# Patient Record
Sex: Female | Born: 1940 | Race: Black or African American | Hispanic: No | Marital: Married | Smoking: Never smoker
Health system: Southern US, Community
[De-identification: ages and names within clinical notes are randomized; demographics above are authoritative.]

## PROBLEM LIST (undated history)

## (undated) DIAGNOSIS — S43006A Unspecified dislocation of unspecified shoulder joint, initial encounter: Secondary | ICD-10-CM

## (undated) DIAGNOSIS — I10 Essential (primary) hypertension: Secondary | ICD-10-CM

## (undated) DIAGNOSIS — E7800 Pure hypercholesterolemia, unspecified: Secondary | ICD-10-CM

## (undated) HISTORY — DX: Unspecified dislocation of unspecified shoulder joint, initial encounter: S43.006A

## (undated) HISTORY — PX: APPENDECTOMY: SHX54

## (undated) HISTORY — PX: CHOLECYSTECTOMY: SHX55

---

## 1999-06-10 ENCOUNTER — Ambulatory Visit (HOSPITAL_COMMUNITY): Admission: RE | Admit: 1999-06-10 | Discharge: 1999-06-10 | Payer: Self-pay | Admitting: Obstetrics & Gynecology

## 1999-10-07 ENCOUNTER — Emergency Department (HOSPITAL_COMMUNITY): Admission: EM | Admit: 1999-10-07 | Discharge: 1999-10-07 | Payer: Self-pay | Admitting: Emergency Medicine

## 2000-01-20 ENCOUNTER — Other Ambulatory Visit: Admission: RE | Admit: 2000-01-20 | Discharge: 2000-01-20 | Payer: Self-pay | Admitting: Obstetrics and Gynecology

## 2001-08-29 ENCOUNTER — Other Ambulatory Visit: Admission: RE | Admit: 2001-08-29 | Discharge: 2001-08-29 | Payer: Self-pay | Admitting: Obstetrics and Gynecology

## 2003-04-01 ENCOUNTER — Other Ambulatory Visit: Admission: RE | Admit: 2003-04-01 | Discharge: 2003-04-01 | Payer: Self-pay | Admitting: Obstetrics and Gynecology

## 2006-02-19 ENCOUNTER — Encounter: Admission: RE | Admit: 2006-02-19 | Discharge: 2006-02-19 | Payer: Self-pay | Admitting: Orthopedic Surgery

## 2006-09-21 ENCOUNTER — Ambulatory Visit: Payer: Self-pay | Admitting: Gastroenterology

## 2006-10-05 ENCOUNTER — Encounter (INDEPENDENT_AMBULATORY_CARE_PROVIDER_SITE_OTHER): Payer: Self-pay | Admitting: Specialist

## 2006-10-05 ENCOUNTER — Ambulatory Visit: Payer: Self-pay | Admitting: Gastroenterology

## 2007-02-01 ENCOUNTER — Encounter: Admission: RE | Admit: 2007-02-01 | Discharge: 2007-02-01 | Payer: Self-pay | Admitting: Orthopedic Surgery

## 2007-02-09 ENCOUNTER — Encounter: Admission: RE | Admit: 2007-02-09 | Discharge: 2007-02-09 | Payer: Self-pay | Admitting: Orthopedic Surgery

## 2007-02-12 ENCOUNTER — Encounter: Admission: RE | Admit: 2007-02-12 | Discharge: 2007-02-12 | Payer: Self-pay | Admitting: Orthopedic Surgery

## 2010-01-26 ENCOUNTER — Encounter: Admission: RE | Admit: 2010-01-26 | Discharge: 2010-01-26 | Payer: Self-pay | Admitting: Orthopedic Surgery

## 2011-03-18 ENCOUNTER — Ambulatory Visit
Admission: RE | Admit: 2011-03-18 | Discharge: 2011-03-18 | Disposition: A | Discharge status: Home / Self Care | Payer: Medicare Other | Source: Ambulatory Visit | Attending: Orthopedic Surgery | Admitting: Orthopedic Surgery

## 2011-03-18 ENCOUNTER — Other Ambulatory Visit: Payer: Self-pay | Admitting: Orthopedic Surgery

## 2011-03-18 DIAGNOSIS — R52 Pain, unspecified: Secondary | ICD-10-CM

## 2011-07-22 ENCOUNTER — Emergency Department (HOSPITAL_COMMUNITY)
Admission: EM | Admit: 2011-07-22 | Discharge: 2011-07-22 | Disposition: A | Discharge status: Home / Self Care | Payer: Medicare Other | Attending: Emergency Medicine | Admitting: Emergency Medicine

## 2011-07-22 ENCOUNTER — Emergency Department (HOSPITAL_COMMUNITY): Payer: Medicare Other

## 2011-07-22 DIAGNOSIS — I10 Essential (primary) hypertension: Secondary | ICD-10-CM | POA: Insufficient documentation

## 2011-07-22 DIAGNOSIS — R5381 Other malaise: Secondary | ICD-10-CM | POA: Insufficient documentation

## 2011-07-22 DIAGNOSIS — R05 Cough: Secondary | ICD-10-CM | POA: Insufficient documentation

## 2011-07-22 DIAGNOSIS — R5383 Other fatigue: Secondary | ICD-10-CM | POA: Insufficient documentation

## 2011-07-22 DIAGNOSIS — T6091XA Toxic effect of unspecified pesticide, accidental (unintentional), initial encounter: Secondary | ICD-10-CM | POA: Insufficient documentation

## 2011-07-22 DIAGNOSIS — R059 Cough, unspecified: Secondary | ICD-10-CM | POA: Insufficient documentation

## 2011-07-22 LAB — DIFFERENTIAL
Eosinophils Relative: 0 % (ref 0–5)
Lymphocytes Relative: 12 % (ref 12–46)
Lymphs Abs: 1.1 10*3/uL (ref 0.7–4.0)
Monocytes Absolute: 0.7 10*3/uL (ref 0.1–1.0)
Neutro Abs: 7.6 10*3/uL (ref 1.7–7.7)

## 2011-07-22 LAB — CBC
HCT: 38.3 % (ref 36.0–46.0)
MCHC: 35 g/dL (ref 30.0–36.0)
MCV: 88.7 fL (ref 78.0–100.0)
RDW: 12.9 % (ref 11.5–15.5)
WBC: 9.4 10*3/uL (ref 4.0–10.5)

## 2011-07-22 LAB — POCT I-STAT, CHEM 8
Hemoglobin: 14.3 g/dL (ref 12.0–15.0)
Potassium: 4 mEq/L (ref 3.5–5.1)
TCO2: 26 mmol/L (ref 0–100)

## 2011-07-22 LAB — POCT I-STAT TROPONIN I

## 2011-10-19 ENCOUNTER — Encounter: Payer: Self-pay | Admitting: Gastroenterology

## 2012-03-13 ENCOUNTER — Encounter: Payer: Self-pay | Admitting: Emergency Medicine

## 2012-03-24 ENCOUNTER — Emergency Department (HOSPITAL_COMMUNITY)
Admission: EM | Admit: 2012-03-24 | Discharge: 2012-03-24 | Disposition: A | Discharge status: Home / Self Care | Payer: Medicare Other | Attending: Emergency Medicine | Admitting: Emergency Medicine

## 2012-03-24 ENCOUNTER — Encounter (HOSPITAL_COMMUNITY): Payer: Self-pay | Admitting: *Deleted

## 2012-03-24 ENCOUNTER — Ambulatory Visit (INDEPENDENT_AMBULATORY_CARE_PROVIDER_SITE_OTHER): Payer: Medicare Other | Admitting: Emergency Medicine

## 2012-03-24 DIAGNOSIS — I10 Essential (primary) hypertension: Secondary | ICD-10-CM | POA: Insufficient documentation

## 2012-03-24 DIAGNOSIS — H5500 Unspecified nystagmus: Secondary | ICD-10-CM | POA: Insufficient documentation

## 2012-03-24 DIAGNOSIS — R110 Nausea: Secondary | ICD-10-CM | POA: Insufficient documentation

## 2012-03-24 DIAGNOSIS — H8110 Benign paroxysmal vertigo, unspecified ear: Secondary | ICD-10-CM | POA: Insufficient documentation

## 2012-03-24 DIAGNOSIS — E7800 Pure hypercholesterolemia, unspecified: Secondary | ICD-10-CM | POA: Insufficient documentation

## 2012-03-24 DIAGNOSIS — R42 Dizziness and giddiness: Secondary | ICD-10-CM

## 2012-03-24 DIAGNOSIS — R109 Unspecified abdominal pain: Secondary | ICD-10-CM | POA: Insufficient documentation

## 2012-03-24 DIAGNOSIS — Z79899 Other long term (current) drug therapy: Secondary | ICD-10-CM | POA: Insufficient documentation

## 2012-03-24 HISTORY — DX: Pure hypercholesterolemia, unspecified: E78.00

## 2012-03-24 HISTORY — DX: Essential (primary) hypertension: I10

## 2012-03-24 LAB — POCT CBC
HCT, POC: 41.1 % (ref 37.7–47.9)
Lymph, poc: 1.6 (ref 0.6–3.4)
MCHC: 32.4 g/dL (ref 31.8–35.4)
MCV: 91.1 fL (ref 80–97)
MPV: 9.3 fL (ref 0–99.8)
Platelet Count, POC: 336 10*3/uL (ref 142–424)
RDW, POC: 13.4 %
WBC: 6.5 10*3/uL (ref 4.6–10.2)

## 2012-03-24 LAB — POCT I-STAT, CHEM 8
Calcium, Ion: 1.24 mmol/L (ref 1.12–1.32)
Hemoglobin: 13.3 g/dL (ref 12.0–15.0)
Sodium: 144 mEq/L (ref 135–145)
TCO2: 25 mmol/L (ref 0–100)

## 2012-03-24 MED ORDER — ONDANSETRON 4 MG PO TBDP: 4.0000 mg | ORAL_TABLET | Freq: Three times a day (TID) | ORAL | Status: AC | PRN

## 2012-03-24 MED ORDER — DIAZEPAM 5 MG PO TABS: 5.0000 mg | ORAL_TABLET | Freq: Two times a day (BID) | ORAL | Status: AC

## 2012-03-24 MED ORDER — SODIUM CHLORIDE 0.9 % IV BOLUS (SEPSIS)
1000.0000 mL | Freq: Once | INTRAVENOUS | Status: AC
Start: 2012-03-24 — End: 2012-03-24
  Administered 2012-03-24: 1000 mL via INTRAVENOUS

## 2012-03-24 MED ORDER — ONDANSETRON 4 MG PO TBDP
8.0000 mg | ORAL_TABLET | Freq: Once | ORAL | Status: AC
  Administered 2012-03-24: 8 mg via ORAL

## 2012-03-24 MED ORDER — DIAZEPAM 5 MG PO TABS
5.0000 mg | ORAL_TABLET | Freq: Once | ORAL | Status: AC
  Administered 2012-03-24: 5 mg via ORAL
  Filled 2012-03-24: qty 1

## 2012-03-24 MED ORDER — PROMETHAZINE HCL 25 MG/ML IJ SOLN
12.5000 mg | Freq: Once | INTRAMUSCULAR | Status: AC
  Administered 2012-03-24: 12.5 mg via INTRAVENOUS
  Filled 2012-03-24: qty 1

## 2012-03-24 NOTE — ED Provider Notes (Signed)
History     CSN: [id number]  Arrival date & time 03/24/12  1240   First MD Initiated Contact with Patient 03/24/12 1243      Chief Complaint  Patient presents with  . Dizziness  . Nausea  . Abdominal Pain     HPI Per EMS. Pt woke up this AM with dizziness and nausea. Pt reports this was worse while laying down. Pt reports feels the room is spinning. Pt went to Urgent Care. Pt had a negative neuro exam. No monitor or blood work was completed.  Patient reports similar episodes in the past.  Past Medical History  Diagnosis Date  . Hypertension   . Hypercholesteremia     Past Surgical History  Procedure Date  . Cholecystectomy   . Appendectomy     No family history on file.  History  Substance Use Topics  . Smoking status: Never Smoker   . Smokeless tobacco: Not on file  . Alcohol Use: No    OB History    Grav Para Term Preterm Abortions TAB SAB Ect Mult Living                  Review of Systems  All other systems reviewed and are negative.    Allergies  Latex; Advil; Celebrex; Food; and Influenza vac split  Home Medications   Current Outpatient Rx  Name Route Sig Dispense Refill  . AMLODIPINE BESYLATE 10 MG PO TABS Oral Take 10 mg by mouth daily.    Marland Kitchen BILBERRY (VACCINIUM MYRTILLUS) 30 MG PO CAPS Oral Take 1 capsule by mouth daily.    Marland Kitchen CALCIUM CARBONATE-VITAMIN D 500-200 MG-UNIT PO TABS Oral Take 1 tablet by mouth daily.    Marland Kitchen COENZYME Q10 30 MG PO CAPS Oral Take 30 mg by mouth 3 (three) times daily.    Marland Kitchen OVER THE COUNTER MEDICATION Oral Take 1 tablet by mouth daily. Unidentified vitamin. Patient can not remember name of vitamin    . VITAMIN B-12 100 MCG PO TABS Oral Take 1,000 mcg by mouth daily.     Marland Kitchen DIAZEPAM 5 MG PO TABS Oral Take 1 tablet (5 mg total) by mouth 2 (two) times daily. 10 tablet 0  . ONDANSETRON 4 MG PO TBDP Oral Take 1 tablet (4 mg total) by mouth every 8 (eight) hours as needed for nausea. 20 tablet 0    BP 133/64  Pulse 71   Temp(Src) 97.5 F (36.4 C) (Oral)  Resp 16  SpO2 100%  Physical Exam  Nursing note and vitals reviewed. Constitutional: She is oriented to person, place, and time. She appears well-developed and well-nourished. No distress.  HENT:  Head: Normocephalic and atraumatic.  Eyes: Conjunctivae are normal. Pupils are equal, round, and reactive to light. Right eye exhibits nystagmus. Right eye exhibits normal extraocular motion. Left eye exhibits nystagmus. Left eye exhibits normal extraocular motion.  Neck: Normal range of motion.  Cardiovascular: Normal rate and intact distal pulses.         Date: 03/24/2012  Rate: 70  Rhythm: normal sinus rhythm  QRS Axis: normal  Intervals: normal  ST/T Wave abnormalities: normal  Conduction Disutrbances: none  Narrative Interpretation: unremarkable      Pulmonary/Chest: No respiratory distress.  Abdominal: Normal appearance. She exhibits no distension.  Musculoskeletal: Normal range of motion.  Neurological: She is alert and oriented to person, place, and time. She has normal strength. No cranial nerve deficit or sensory deficit. GCS eye subscore is 4. GCS verbal subscore is  5. GCS motor subscore is 6.       Positional vertigo positive  Skin: Skin is warm and dry. No rash noted.  Psychiatric: She has a normal mood and affect. Her behavior is normal.    ED Course  Procedures (including critical care time)  Labs Reviewed  POCT I-STAT, CHEM 8 - Abnormal; Notable for the following:    Glucose, Bld 108 (*)    All other components within normal limits  POCT I-STAT TROPONIN I   No results found.   1. Benign positional vertigo       MDM          Nelia Shi, MD 03/24/12 1446

## 2012-03-24 NOTE — Discharge Instructions (Signed)

## 2012-03-24 NOTE — ED Notes (Signed)
ZOX:[id number]<AV> Expected date:<BR> Expected time:12:38 PM<BR> Means of arrival:Ambulance<BR> Comments:<BR> M90 -- Vertigo

## 2012-03-24 NOTE — ED Notes (Signed)
Per EMS. Pt woke up this AM with dizziness and nausea. Pt reports this was worse while laying down. Pt reports feels the room is spinning. Pt went to Urgent Care.  Pt had a negative neuro exam.  No monitor or blood work was completed.

## 2012-03-24 NOTE — Progress Notes (Signed)
  Subjective:    Patient ID: Judy Caldwell, female    DOB: [birth year], 71 y.o.   MRN: [id number]  HPI patient with history of hypertension presents with onset on Tuesday of nausea. She has had a very poor appetite due to the nausea. She has not had any actual vomiting but has had episodes of gagging. She has a history of vertigo in the she has no history of any gastrointestinal problems. This morning she had extreme dizziness she described severe vertiginous symptoms as she leaned back. Since then she's had extreme nausea and difficulty walking.    Review of Systems she is a history of hypertension on medication she has a history of high cholesterol but is not on statins. She has a history of multiple drug allergies     Objective:   Physical Exam physical exam reveals an African woman who does appear somewhat pale. She needed assistance from the chair to the exam table. Her pupils were equal and reactive I did not disclose any disconjugate eye movements. She did not have diplopia on comp temptation to carotids were 2+ no murmurs cardiac exam revealed a regular rate and rhythm without murmurs. When she was laid back she had extreme vertiginous symptoms with nausea. She had some gagging but was not able to vomit.        Assessment & Plan:   Symptoms are most consistent with acute labyrinthitis. She does have high cholesterol and high blood pressure and therefore is at risk for vertebral basilar issues. She is unable to manage at home because she is unable to walk and to extreme vertigo and unsteadiness. We'll go ahead and start IV line and gave Zofran 8 mg ODT to see if we can help with the nausea.

## 2012-03-24 NOTE — ED Notes (Signed)
Pt reports she woke up with vertigo this AM.  Pt reports history of vertigo and typically associated with ear issue.  Pt was nauseated earlier this AM and reports mild nausea now.  Pt denies fevers.  Pt denies issues with ambulation today and denies weakness.

## 2012-04-08 IMAGING — CR DG CHEST 2V
2 series · 2 of 2 positions shown · non-contrast
Comparison: None

CLINICAL DATA: Cough and weakness; possible accidental inhalation
of insect spray.

CHEST - 2 VIEW

[w chest pa]
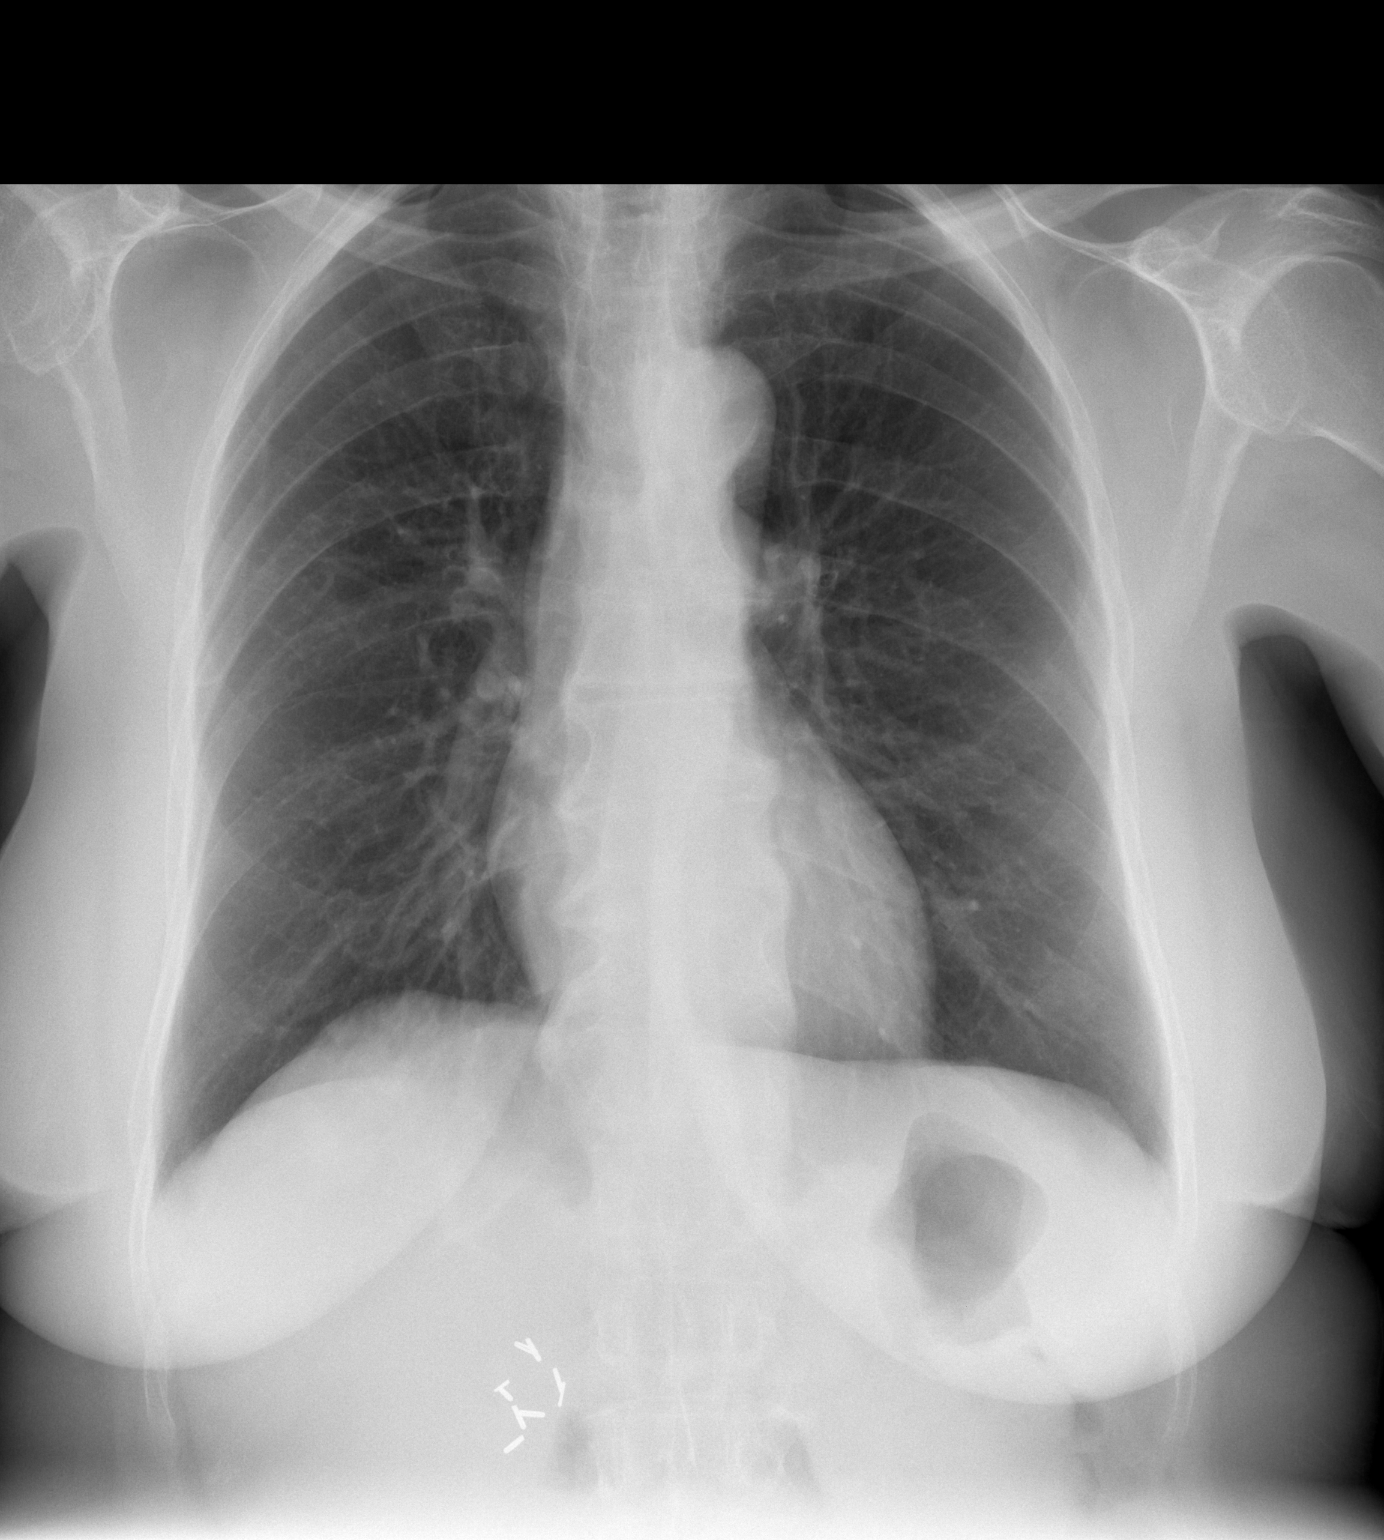

[w chest lat]
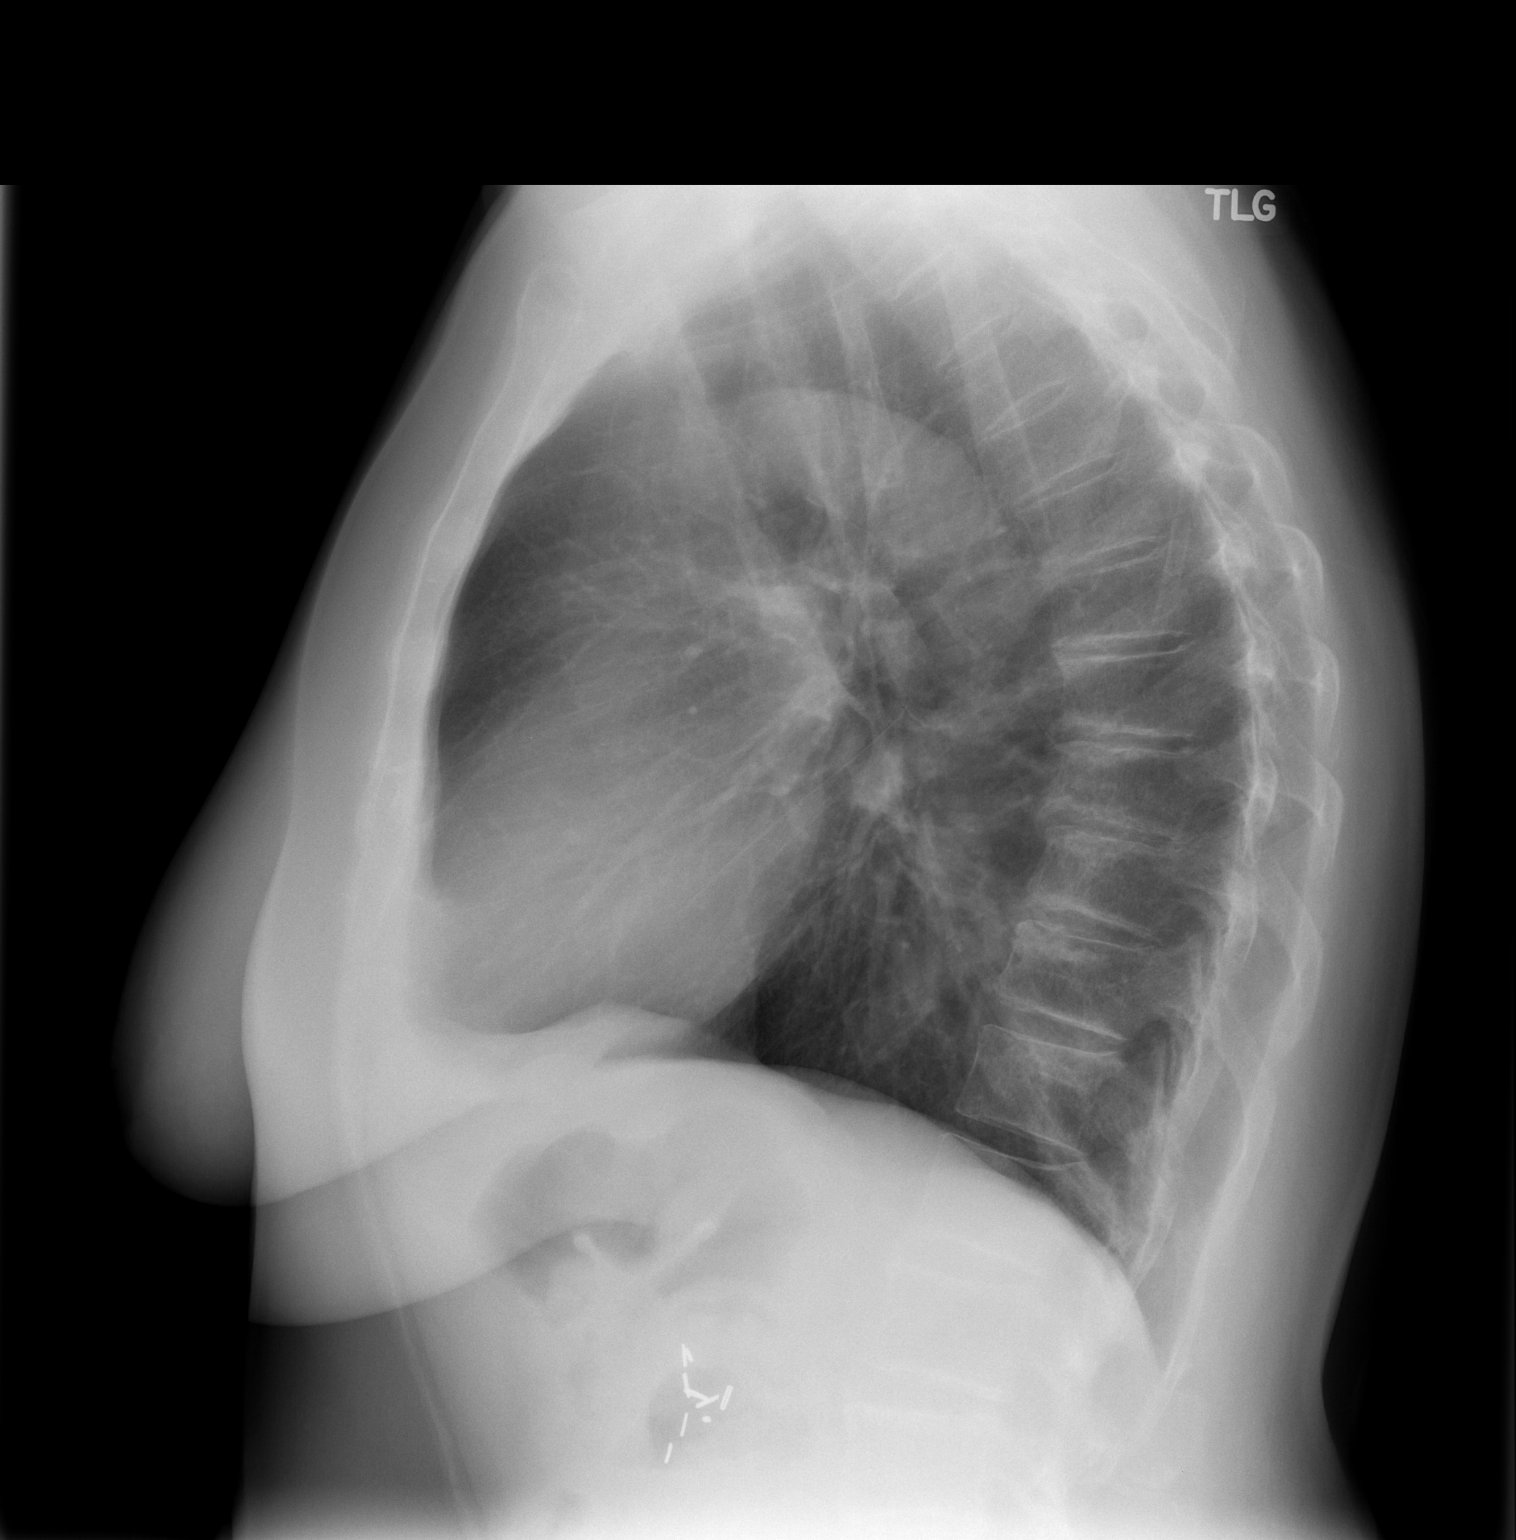

[2 of 2 positions shown; findings below may reference images not displayed]

FINDINGS: The lungs are well-aerated and clear.  There is no
evidence of focal opacification, pleural effusion or pneumothorax.

The heart is normal in size; the mediastinal contour is within
normal limits.  No acute osseous abnormalities are seen.  Clips are
noted within the right upper quadrant, reflecting prior
cholecystectomy.
IMPRESSION: No acute cardiopulmonary process seen.

## 2012-05-14 ENCOUNTER — Ambulatory Visit (INDEPENDENT_AMBULATORY_CARE_PROVIDER_SITE_OTHER): Payer: MEDICARE | Admitting: Emergency Medicine

## 2012-05-14 VITALS — BP 153/80 | HR 70 | Temp 97.9°F | Resp 16 | Ht 62.0 in | Wt 154.6 lb

## 2012-05-14 DIAGNOSIS — M792 Neuralgia and neuritis, unspecified: Secondary | ICD-10-CM

## 2012-05-14 DIAGNOSIS — IMO0002 Reserved for concepts with insufficient information to code with codable children: Secondary | ICD-10-CM

## 2012-05-14 DIAGNOSIS — B029 Zoster without complications: Secondary | ICD-10-CM

## 2012-05-14 MED ORDER — GABAPENTIN 100 MG PO CAPS: ORAL_CAPSULE | ORAL | Status: DC

## 2012-05-14 MED ORDER — VALACYCLOVIR HCL 1 G PO TABS: ORAL_TABLET | ORAL | Status: DC

## 2012-05-14 NOTE — Progress Notes (Signed)
  Subjective:    Patient ID: Judy Caldwell, female    DOB: Jan 24, 1941, 71 y.o.   MRN: [id number]  HPI patient in for a burning stinging pain located over the right scapula and right shoulder area. She denies any rash or pain in her neck. There is some question about when she was visiting her daughter a few months ago she was diagnosed with shingles at that time she has never received the shingles vaccine.    Review of Systems she denies chest pain shortness of breath or any other symptoms.     Objective:   Physical Exam there is no rash noted over the right scapula right side of her chest. Breath sounds are clear she has exquisite sensitivity to the skin in that area.        Assessment & Plan:  Patient has hyperesthesia of the skin over the scapula and shoulder this could be early shingles. She certainly is reasonable to treat her with Valtrex and Neurontin.

## 2012-05-14 NOTE — Patient Instructions (Signed)
Postherpetic Neuralgia  Shingles is a painful disease. It is caused by the herpes zoster virus. This is the same virus which also causes chickenpox. It can affect the torso, limbs, or the face. For most people, shingles is a condition of rather sudden onset. Pain usually lasts about 1 month.  In older patients, or patients with poor immune systems, a painful, long-standing (chronic) condition called postherpetic neuralgia can develop. This condition rarely happens before age 50. But at least 50% of people over 50 become affected following an attack of shingles. There is a natural tendency for this condition to improve over time with no treatment. Less than 5% of patients have pain that lasts for more than 1 year.  DIAGNOSIS   Herpes is usually easily diagnosed on physical exam. Pain sometimes follows when the skin sores (lesions) have disappeared. It is called postherpetic neuralgia. That name simply means the pain that follows herpes.  TREATMENT    Treating this condition may be difficult. Usually one of the tricyclic antidepressants, often amitriptyline, is the first line of treatment. There is evidence that the sooner these medications are given, the more likely they are to reduce pain.   Conventional analgesics, regional nerve blocks, and anticonvulsants have little benefit in most cases when used alone. Other tricyclic anti-depressants are used as a second option if the first antidepressant is unsuccessful.   Anticonvulsants, including carbamazepine, have been found to provide some added benefit when used with a tricyclic anti-depressant. This is especially for the stabbing type of pain similar to that of trigeminal neuralgia.   Chronic opioid therapy. This is a strong narcotic pain medication. It is used to treat pain that is resistant to other measures. The issues of dependency and tolerance can be reduced with closely managed care.   Some cream treatments are applied locally to the affected area. They  can help when used with other treatments. Their use may be difficult in the case of postherpetic trigeminal neuralgia. This is involved with the face. So the substances can irritate the eye and the skin around the eye. Examples of creams used include Capsaicin and lidocaine creams.   For shingles, antiviral therapies along with analgesics are recommended. Studies of the effect of anti-viral agents such as acyclovir on shingles have been done. They show improved rates of healing and decreased severity of sudden (acute) pain. Some observations suggest that nerve blocks during shingles infection will:   Reduce pain.   Shorten the acute episode.   Prevent the emergence of postherpetic neuralgia.  Viral medications used include Acyclovir (Zovirax), Valacyclovir, Famciclovir and a lysine diet.  Document Released: 02/11/2003 Document Revised: 11/10/2011 Document Reviewed: 11/21/2005  ExitCare Patient Information 2012 [employer].

## 2012-05-15 ENCOUNTER — Other Ambulatory Visit: Payer: Self-pay | Admitting: *Deleted

## 2012-05-15 DIAGNOSIS — M792 Neuralgia and neuritis, unspecified: Secondary | ICD-10-CM

## 2012-05-15 MED ORDER — GABAPENTIN 100 MG PO CAPS: ORAL_CAPSULE | ORAL | Status: DC

## 2012-07-02 ENCOUNTER — Telehealth: Payer: Self-pay

## 2012-07-02 NOTE — Telephone Encounter (Signed)
Request a refill on blood pressure medication and she knows that she needs an appt with dr daub-not sure if scheduled yet or not-she is hoping that we could call this in tonight because her daughter has been in an accident and she is  Needing to get to [employer]

## 2012-07-03 MED ORDER — AMLODIPINE BESYLATE 10 MG PO TABS: 10.0000 mg | ORAL_TABLET | Freq: Every day | ORAL | Status: DC

## 2012-07-03 NOTE — Telephone Encounter (Signed)
Sent to pharmacy 

## 2012-07-03 NOTE — Telephone Encounter (Signed)
Pt calling about her Norvasc 10mg , she needs it sent to [facility] [place] she states she has appt the middle of August with Dr Cleta Alberts.

## 2012-07-03 NOTE — Telephone Encounter (Signed)
LMOM notifying patient rx sent in. 

## 2012-07-31 ENCOUNTER — Encounter: Payer: Self-pay | Admitting: Emergency Medicine

## 2012-07-31 ENCOUNTER — Ambulatory Visit (INDEPENDENT_AMBULATORY_CARE_PROVIDER_SITE_OTHER): Payer: MEDICARE | Admitting: Emergency Medicine

## 2012-07-31 VITALS — BP 148/74 | HR 83 | Temp 98.2°F | Resp 16 | Ht 62.25 in | Wt 155.0 lb

## 2012-07-31 DIAGNOSIS — E782 Mixed hyperlipidemia: Secondary | ICD-10-CM

## 2012-07-31 DIAGNOSIS — R7989 Other specified abnormal findings of blood chemistry: Secondary | ICD-10-CM

## 2012-07-31 DIAGNOSIS — M792 Neuralgia and neuritis, unspecified: Secondary | ICD-10-CM

## 2012-07-31 DIAGNOSIS — I10 Essential (primary) hypertension: Secondary | ICD-10-CM

## 2012-07-31 DIAGNOSIS — M79609 Pain in unspecified limb: Secondary | ICD-10-CM

## 2012-07-31 DIAGNOSIS — R739 Hyperglycemia, unspecified: Secondary | ICD-10-CM

## 2012-07-31 MED ORDER — AMLODIPINE BESYLATE 10 MG PO TABS: 10.0000 mg | ORAL_TABLET | Freq: Every day | ORAL | Status: DC

## 2012-07-31 MED ORDER — GABAPENTIN 100 MG PO CAPS: ORAL_CAPSULE | ORAL | Status: DC

## 2012-07-31 NOTE — Progress Notes (Signed)
  Subjective:    Patient ID: Judy Caldwell, female    DOB: 05/08/1941, 71 y.o.   MRN: [id number]  HPI Judy Caldwell comes in for followup of her high blood pressure. She has had borderline high sugars in the past and this needs to be checked. She does have pain in her back down her right leg in an area where she has had shingles. She does walk with the assistance of a cane and does have some significant orthopedic problems. She sees her brother-in-law Dr. Myrtie Neither for these problems. The last visit here she was seen emergently and sent to the hospital for evaluation because of severe vertigo. Workup at that time was unremarkable.    Review of Systems     Objective:   Physical Exam HEENT exam is unremarkable her neck is supple chest clear heart regular rate no murmurs her abdomen was soft and nontender.        Assessment & Plan:  I refilled her Norvasc today for blood pressure. I have restarted her Neurontin 300 mg at night and she can take 100 mg capsules 1-2 during the day as tolerated.

## 2012-08-28 ENCOUNTER — Other Ambulatory Visit: Payer: Self-pay | Admitting: Physician Assistant

## 2012-09-06 ENCOUNTER — Encounter: Payer: Self-pay | Admitting: Gastroenterology

## 2012-12-17 ENCOUNTER — Encounter: Payer: Self-pay | Admitting: Gastroenterology

## 2013-01-15 ENCOUNTER — Ambulatory Visit (AMBULATORY_SURGERY_CENTER): Payer: MEDICARE | Admitting: *Deleted

## 2013-01-15 VITALS — Ht 62.0 in | Wt 148.0 lb

## 2013-01-15 DIAGNOSIS — Z1211 Encounter for screening for malignant neoplasm of colon: Secondary | ICD-10-CM

## 2013-01-15 MED ORDER — NA SULFATE-K SULFATE-MG SULF 17.5-3.13-1.6 GM/177ML PO SOLN: ORAL | Status: DC

## 2013-01-15 NOTE — Progress Notes (Signed)
Pt has allergy to eggs causing throat to swell.  Sedation changed to Moderate sedation on schedule.

## 2013-01-29 ENCOUNTER — Encounter: Payer: Self-pay | Admitting: Gastroenterology

## 2013-01-29 ENCOUNTER — Ambulatory Visit (AMBULATORY_SURGERY_CENTER): Payer: Medicare HMO | Admitting: Gastroenterology

## 2013-01-29 VITALS — BP 120/66 | HR 72 | Temp 97.5°F | Resp 9 | Ht 62.0 in | Wt 148.0 lb

## 2013-01-29 DIAGNOSIS — D126 Benign neoplasm of colon, unspecified: Secondary | ICD-10-CM

## 2013-01-29 MED ORDER — SODIUM CHLORIDE 0.9 % IV SOLN: 500.0000 mL | INTRAVENOUS | Status: DC

## 2013-01-29 NOTE — Patient Instructions (Addendum)
YOU HAD AN ENDOSCOPIC PROCEDURE TODAY AT THE [facility]: Refer to the procedure report that was given to you for any specific questions about what was found during the examination.  If the procedure report does not answer your questions, please call your gastroenterologist to clarify.  If you requested that your care partner not be given the details of your procedure findings, then the procedure report has been included in a sealed envelope for you to review at your convenience later.  YOU SHOULD EXPECT: Some feelings of bloating in the abdomen. Passage of more gas than usual.  Walking can help get rid of the air that was put into your GI tract during the procedure and reduce the bloating. If you had a lower endoscopy (such as a colonoscopy or flexible sigmoidoscopy) you may notice spotting of blood in your stool or on the toilet paper. If you underwent a bowel prep for your procedure, then you may not have a normal bowel movement for a few days.  DIET: Your first meal following the procedure should be a light meal and then it is ok to progress to your normal diet.  A half-sandwich or bowl of soup is an example of a good first meal.  Heavy or fried foods are harder to digest and may make you feel nauseous or bloated.  Likewise meals heavy in dairy and vegetables can cause extra gas to form and this can also increase the bloating.  Drink plenty of fluids but you should avoid alcoholic beverages for 24 hours.  PLEASE TRY TO EAT A HIGH FIBER DIET TO DECREASE YOUR CHANCES OF GETTING DIVERTICULITIS.  ACTIVITY: Your care partner should take you home directly after the procedure.  You should plan to take it easy, moving slowly for the rest of the day.  You can resume normal activity the day after the procedure however you should NOT DRIVE or use heavy machinery for 24 hours (because of the sedation medicines used during the test).    SYMPTOMS TO REPORT IMMEDIATELY: A gastroenterologist can be reached  at any hour.  During normal business hours, 8:30 AM to 5:00 PM Monday through Friday, call [phone].  After hours and on weekends, please call the GI answering service at [phone] who will take a message and have the physician on call contact you   Following lower endoscopy (colonoscopy or flexible sigmoidoscopy):  Excessive amounts of blood in the stool  Significant tenderness or worsening of abdominal pains  Swelling of the abdomen that is new, acute  Fever of 100F or higher  FOLLOW UP: If any biopsies were taken you will be contacted by phone or by letter within the next 1-3 weeks.  Call your gastroenterologist if you have not heard about the biopsies in 3 weeks.  Our staff will call the home number listed on your records the next business day following your procedure to check on you and address any questions or concerns that you may have at that time regarding the information given to you following your procedure. This is a courtesy call and so if there is no answer at the home number and we have not heard from you through the emergency physician on call, we will assume that you have returned to your regular daily activities without incident.  SIGNATURES/CONFIDENTIALITY: You and/or your care partner have signed paperwork which will be entered into your electronic medical record.  These signatures attest to the fact that that the information above on your After  Visit Summary has been reviewed and is understood.  Full responsibility of the confidentiality of this discharge information lies with you and/or your care-partner.  Thank-you for choosing [place] for your medical needs today.

## 2013-01-29 NOTE — Progress Notes (Signed)
Sedation vewrsed fentanly pt tolerated procedure well awake to pacu

## 2013-01-29 NOTE — Op Note (Signed)
[facility] [address] [place], [address]   COLONOSCOPY PROCEDURE REPORT  PATIENT: Judy Caldwell, Judy Caldwell  MR#: [id number] BIRTHDATE: 1941-05-20 , 71  yrs. old GENDER: Female ENDOSCOPIST: Louis Meckel, MD REFERRED [id number] Cleta Alberts, M.D. PROCEDURE DATE:  01/29/2013 PROCEDURE:   Colonoscopy with cold biopsy polypectomy ASA CLASS:   Class II INDICATIONS:average risk screening. MEDICATIONS: Versed 4 mg IV, Fentanyl-Quick Pick, and Fentanyl-Detailed 250 mcg IV  DESCRIPTION OF PROCEDURE:   After the risks benefits and alternatives of the procedure were thoroughly explained, informed consent was obtained.  A digital rectal exam revealed no abnormalities of the rectum.   The LB [id number]  endoscope was introduced through the anus and advanced to the cecum, which was identified by both the appendix and ileocecal valve. No adverse events experienced.   The quality of the prep was Suprep good  The instrument was then slowly withdrawn as the colon was fully examined.      COLON FINDINGS: A sessile polyp measuring 2 mm in size was found in the ascending colon.  A polypectomy was performed with cold forceps.  The resection was complete and the polyp tissue was completely retrieved.   Severe diverticulosis was noted in the sigmoid colon.   The colon mucosa was otherwise normal. Retroflexed views revealed no abnormalities. The time to cecum=2 minutes 28 seconds.  Withdrawal time=7 minutes 33 seconds.  The scope was withdrawn and the procedure completed. COMPLICATIONS: There were no complications.  ENDOSCOPIC IMPRESSION: 1.   Sessile polyp measuring 2 mm in size was found in the ascending colon; polypectomy was performed with cold forceps 2.   Severe diverticulosis was noted in the sigmoid colon 3.   The colon mucosa was otherwise normal  RECOMMENDATIONS: If the polyp(s) removed today are proven to be adenomatous (pre-cancerous) polyps, you will need a  repeat colonoscopy in 5 years.  Otherwise you should continue to follow colorectal cancer screening guidelines for "routine risk" patients with colonoscopy in 10 years.  You will receive a letter within 1-2 weeks with the results of your biopsy as well as final recommendations.  Please call my office if you have not received a letter after 3 weeks.   eSigned:  Louis Meckel, MD 01/29/2013 11:41 AM   cc:

## 2013-01-29 NOTE — Progress Notes (Addendum)
Patient did not have preoperative order for IV antibiotic SSI prophylaxis. (G8918)  Patient did not experience any of the following events: a burn prior to discharge; a fall within the facility; wrong site/side/patient/procedure/implant event; or a hospital transfer or hospital admission upon discharge from the facility. (G8907)  

## 2013-01-30 ENCOUNTER — Telehealth: Payer: Self-pay

## 2013-01-30 NOTE — Telephone Encounter (Signed)
  Follow up Call-  Call back number 01/29/2013  Post procedure Call Back phone  # [phone]  Permission to leave phone message Yes     Patient questions:  Do you have a fever, pain , or abdominal swelling? no Pain Score  0 *  Have you tolerated food without any problems? yes  Have you been able to return to your normal activities? yes  Do you have any questions about your discharge instructions: Diet   no Medications  no Follow up visit  no  Do you have questions or concerns about your Care? no  Actions: * If pain score is 4 or above: No action needed, pain <4.

## 2013-02-05 ENCOUNTER — Encounter: Payer: Self-pay | Admitting: Gastroenterology

## 2013-08-28 ENCOUNTER — Ambulatory Visit (INDEPENDENT_AMBULATORY_CARE_PROVIDER_SITE_OTHER): Payer: Medicare HMO | Admitting: Cardiovascular Disease

## 2013-08-28 ENCOUNTER — Encounter: Payer: Self-pay | Admitting: Cardiovascular Disease

## 2013-08-28 VITALS — BP 144/90 | HR 89 | Ht 62.0 in | Wt 148.3 lb

## 2013-08-28 DIAGNOSIS — Z79899 Other long term (current) drug therapy: Secondary | ICD-10-CM

## 2013-08-28 DIAGNOSIS — I10 Essential (primary) hypertension: Secondary | ICD-10-CM

## 2013-08-28 DIAGNOSIS — E785 Hyperlipidemia, unspecified: Secondary | ICD-10-CM

## 2013-08-28 NOTE — Patient Instructions (Addendum)
Your physician recommends that you return for lab work when fasting. We will call you with the lab results.  Your physician recommends that you schedule a follow-up appointment in: 12 months

## 2013-08-29 ENCOUNTER — Telehealth: Payer: Self-pay | Admitting: Cardiovascular Disease

## 2013-08-29 DIAGNOSIS — E785 Hyperlipidemia, unspecified: Secondary | ICD-10-CM | POA: Insufficient documentation

## 2013-08-29 DIAGNOSIS — I10 Essential (primary) hypertension: Secondary | ICD-10-CM | POA: Insufficient documentation

## 2013-08-29 LAB — LIPID PANEL
LDL Cholesterol: 167 mg/dL — ABNORMAL HIGH (ref 0–99)
Total CHOL/HDL Ratio: 3.2 Ratio
VLDL: 14 mg/dL (ref 0–40)

## 2013-08-29 LAB — COMPREHENSIVE METABOLIC PANEL
ALT: 13 U/L (ref 0–35)
AST: 22 U/L (ref 0–37)
Albumin: 4.3 g/dL (ref 3.5–5.2)
Alkaline Phosphatase: 57 U/L (ref 39–117)
Calcium: 9.6 mg/dL (ref 8.4–10.5)
Chloride: 105 mEq/L (ref 96–112)
Potassium: 4 mEq/L (ref 3.5–5.3)

## 2013-08-29 MED ORDER — AMLODIPINE BESYLATE 10 MG PO TABS: 10.0000 mg | ORAL_TABLET | Freq: Every day | ORAL | Status: DC

## 2013-08-29 NOTE — Telephone Encounter (Signed)
Can we refill her Norvasc (generic) for her since this was given at Urgent Care.  She saw Dr. Salena Saner yesterday.  [facility] [address].

## 2013-08-29 NOTE — Progress Notes (Signed)
Patient ID: Judy Caldwell, female   DOB: 23-Feb-1941, 72 y.o.   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 She is mildly overweight and has actually lost 10 pounds since her last appointment. This is apparently secondary to poor appetite and has occurred in parallel with a protracted course of intercostal shingles. She has had 2 take antivirals for recurrent episodes of shingles.  Her family indicated via one of our nurses that they're worried that she may have some memory problems. I gently inquired about this and the patient denies any issues with her memory. As far as I can tell from a quick bedside assessment I do not find clear evidence of short-term memory lapses, although there may be some issues with attention.  She has known hypercholesterolemia primarily elevated LDL cholesterol with excellent HDL and triglyceride levels. In March of 2013 total close was 236, triglycerides 60, HDL 74 and LDL 150. I had recommended treatment with a statin but she declined because of previous side effects: "It altered my body".    Allergies  Allergen Reactions  . Latex Hives  . Advil [Ibuprofen] Nausea Only  . Celebrex [Celecoxib] Nausea Only  . Eggs Or Egg-Derived Products Swelling    THROAT CLOSES  . Influenza Vac Split [Flu Virus Vaccine] Other (See Comments)    Throat swells    Current Outpatient Prescriptions  Medication Sig Dispense Refill  . amLODipine (NORVASC) 10 MG tablet TAKE 1 TABLET BY MOUTH EVERY DAY  30 tablet  0  . calcium-vitamin D (OSCAL WITH D) 500-200 MG-UNIT per tablet Take 1 tablet by mouth daily.      Marland Kitchen co-enzyme Q-10 30 MG capsule Take 30 mg by mouth 3 (three) times daily.      . COCONUT OIL PO Take by mouth 2 (two) times daily.      Marland Kitchen gabapentin (NEURONTIN) 100 MG capsule as needed. Take 1 cap daily  for 2 days , 2 caps daily for 2 days, then 3 caps daily      . valACYclovir (VALTREX) 1000 MG tablet as needed. Take one tablet 3 times a day for one week      . vitamin B-12 (CYANOCOBALAMIN) 100 MCG tablet Take 1,000 mcg by mouth daily.        No current facility-administered medications for this visit.    Past Medical History  Diagnosis Date  . Hypertension   . Hypercholesteremia   . Shoulder joint dislocation     Past Surgical History  Procedure Laterality Date  . Cholecystectomy    . Appendectomy      Family History  Problem Relation Age of Onset  . Colon cancer Neg Hx   . Stomach cancer Neg Hx   . Kidney disease Mother     History   Social History  . Marital Status: Married    Spouse Name: N/A    Number of Children: N/A  . Years of Education: N/A   Occupational History  . Not on file.   Social History Main Topics  . Smoking status: Never Smoker   . Smokeless tobacco: Never Used  . Alcohol Use: No  . Drug Use: No  . Sexual Activity: Not on file   Other Topics Concern  . Not on file   Social History Narrative  . No narrative on file  Review of systems: The patient specifically denies any chest pain at rest or with exertion, dyspnea at rest or with exertion, orthopnea, paroxysmal nocturnal dyspnea, syncope, palpitations, focal neurological deficits, intermittent claudication, lower extremity edema, unexplained weight gain, cough, hemoptysis or wheezing.  The patient also denies abdominal pain, nausea, vomiting, dysphagia, diarrhea, constipation, polyuria, polydipsia, dysuria, hematuria, frequency, urgency, abnormal bleeding or bruising, fever, chills, unexpected weight changes, mood swings, change in skin or hair texture, change in voice quality, auditory or visual problems, allergic reactions or rashes, new musculoskeletal complaints other than usual "aches and pains".   PHYSICAL EXAM BP 144/90  Pulse 89  Ht 5\' 2"  (1.575 m)  Wt 148 lb 4.8 oz (67.268  kg)  BMI 27.12 kg/m2  General: Alert, oriented x3, no distress Head: no evidence of trauma, PERRL, EOMI, no exophtalmos or lid lag, no myxedema, no xanthelasma; normal ears, nose and oropharynx Neck: normal jugular venous pulsations and no hepatojugular reflux; brisk carotid pulses without delay and no carotid bruits Chest: clear to auscultation, no signs of consolidation by percussion or palpation, normal fremitus, symmetrical and full respiratory excursions; healing zoster eruption Cardiovascular: normal position and quality of the apical impulse, regular rhythm, normal first and second heart sounds, no murmurs, rubs or gallops Abdomen: no tenderness or distention, no masses by palpation, no abnormal pulsatility or arterial bruits, normal bowel sounds, no hepatosplenomegaly Extremities: no clubbing, cyanosis or edema; 2+ radial, ulnar and brachial pulses bilaterally; 2+ right femoral, posterior tibial and dorsalis pedis pulses; 2+ left femoral, posterior tibial and dorsalis pedis pulses; no subclavian or femoral bruits Neurological: grossly nonfocal   EKG: Normal sinus rhythm, normal tracing   BMET    Component Value Date/Time   NA 138 08/28/2013 1627   K 4.0 08/28/2013 1627   CL 105 08/28/2013 1627   CO2 28 08/28/2013 1627   GLUCOSE 95 08/28/2013 1627   BUN 11 08/28/2013 1627   CREATININE 0.83 08/28/2013 1627   CREATININE 0.70 03/24/2012 1321   CALCIUM 9.6 08/28/2013 1627     ASSESSMENT AND PLAN Hyperlipidemia We will have to order laboratory tests. At her last appointment her total cholesterol is 236 and LDL cholesterol was 150. I recommended that she start taking pravastatin 40 mg daily. She was very reluctant because of previous side effects with cholesterol lower medications and after discussion with her brother (was a physician in (decided not to take medications. Have recommended that we recheck the laboratory results before we make further recommendations. Her memory issues do need  to be explored further. If her lipid profile is similar to last year, then the indication for statin is borderline. If they truly are memory deficits confirmed by mental status examination, this may prove to be a decisive factor in avoiding statin therapy.  HTN (hypertension) Her blood pressure is borderline elevated today. She doesn't wish he checks it at home with typical blood pressure be around 125/85 mm Hg. Indeed when I rechecked it at the end of the visit her blood pressure was down to 136/87 mm Hg. Her diastolic blood pressure is borderline elevated still. Important to note that she has normal renal function. No changes were made in her medications.   Orders Placed This Encounter  Procedures  . Lipid Profile  . Comp Met (CMET)  . EKG 12-Lead   Meds ordered this encounter  Medications  . COCONUT OIL PO    Sig: Take by mouth 2 (two) times daily.  . valACYclovir (VALTREX) 1000 MG tablet  Sig: as needed. Take one tablet 3 times a day for one week  . gabapentin (NEURONTIN) 100 MG capsule    Sig: as needed. Take 1 cap daily for 2 days , 2 caps daily for 2 days, then 3 caps daily    Tamana Hatfield  Thurmon Fair, MD, [facility] [phone] office [phone] pager

## 2013-08-29 NOTE — Assessment & Plan Note (Addendum)
We will have to order laboratory tests. At her last appointment her total cholesterol is 236 and LDL cholesterol was 150. I recommended that she start taking pravastatin 40 mg daily. She was very reluctant because of previous side effects with cholesterol lower medications and after discussion with her brother (was a physician in (decided not to take medications. Have recommended that we recheck the laboratory results before we make further recommendations. Her memory issues do need to be explored further. If her lipid profile is similar to last year, then the indication for statin is borderline. If they truly are memory deficits confirmed by mental status examination, this may prove to be a decisive factor in avoiding statin therapy.

## 2013-08-29 NOTE — Telephone Encounter (Signed)
Returned call.  Pt informed message received.  Confirmed dose and Refill(s) sent to pharmacy.  Pt verbalized understanding and agreed w/ plan.

## 2013-08-29 NOTE — Assessment & Plan Note (Addendum)
Her blood pressure is borderline elevated today. She doesn't wish he checks it at home with typical blood pressure be around 125/85 mm Hg. Indeed when I rechecked it at the end of the visit her blood pressure was down to 136/87 mm Hg. Her diastolic blood pressure is borderline elevated still. Important to note that she has normal renal function. No changes were made in her medications.

## 2013-09-23 ENCOUNTER — Encounter: Payer: Self-pay | Admitting: Cardiovascular Disease

## 2013-11-26 ENCOUNTER — Ambulatory Visit (INDEPENDENT_AMBULATORY_CARE_PROVIDER_SITE_OTHER): Payer: Medicare HMO | Admitting: Cardiovascular Disease

## 2013-11-26 ENCOUNTER — Encounter: Payer: Self-pay | Admitting: Cardiovascular Disease

## 2013-11-26 VITALS — BP 132/68 | HR 80 | Resp 16 | Ht 62.5 in | Wt 143.1 lb

## 2013-11-26 DIAGNOSIS — R413 Other amnesia: Secondary | ICD-10-CM

## 2013-11-26 DIAGNOSIS — E785 Hyperlipidemia, unspecified: Secondary | ICD-10-CM

## 2013-11-26 DIAGNOSIS — Z79899 Other long term (current) drug therapy: Secondary | ICD-10-CM

## 2013-11-26 DIAGNOSIS — I10 Essential (primary) hypertension: Secondary | ICD-10-CM

## 2013-11-26 MED ORDER — PRAVASTATIN SODIUM 40 MG PO TABS: 40.0000 mg | ORAL_TABLET | Freq: Every evening | ORAL | Status: DC

## 2013-11-26 NOTE — Patient Instructions (Addendum)
Your physician recommends that you schedule a follow-up appointment in: 4 WEEKS with Dr Royann Shivers  PRAVACHOL 40 MG MG DAILY  FASTING LIPID PROFILE & CMET  REFERRAL TO [facility] FOR MEMORY PROBLEMS

## 2013-12-10 ENCOUNTER — Encounter: Payer: Self-pay | Admitting: Cardiovascular Disease

## 2013-12-10 NOTE — Progress Notes (Signed)
Patient ID: Judy Caldwell, female   DOB: 05-22-1941, 73 y.o.   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 She is minimally overweight and has actually lost another 5 pounds since her last appointment. This is apparently secondary to poor appetite.  She has had 2 take antivirals for recurrent episodes of shingles.  Her family again indicates a concern about memory problems, again. She has known hypercholesterolemia: primarily elevated LDL cholesterol with excellent HDL and triglyceride levels. I had recommended treatment with a statin but she declined because of previous side effects ("It altered my body").  She denies cardiac complaints today. Her BP is better.    Allergies  Allergen Reactions  . Latex Hives  . Advil [Ibuprofen] Nausea Only  . Celebrex [Celecoxib] Nausea Only  . Eggs Or Egg-Derived Products Swelling    THROAT CLOSES  . Influenza Vac Split [Flu Virus Vaccine] Other (See Comments)    Throat swells    Current Outpatient Prescriptions  Medication Sig Dispense Refill  . amLODipine (NORVASC) 10 MG tablet Take 1 tablet (10 mg total) by mouth daily.  30 tablet  10  . calcium-vitamin D (OSCAL WITH D) 500-200 MG-UNIT per tablet Take 1 tablet by mouth daily.      Marland Kitchen co-enzyme Q-10 30 MG capsule Take 30 mg by mouth 3 (three) times daily.      . COCONUT OIL PO Take by mouth 2 (two) times daily.      Marland Kitchen gabapentin (NEURONTIN) 100 MG capsule Take 100 mg by mouth as needed. Take 1 cap daily for 2 days , 2 caps daily for 2 days, then 3 caps daily      . valACYclovir (VALTREX) 1000 MG tablet as needed. Take one tablet 3 times a day for one week      . vitamin B-12 (CYANOCOBALAMIN) 100 MCG tablet Take 1,000 mcg by mouth daily.       . pravastatin (PRAVACHOL) 40 MG tablet Take 1 tablet (40 mg total) by mouth every evening.   90 tablet  3   No current facility-administered medications for this visit.    Past Medical History  Diagnosis Date  . Hypertension   . Hypercholesteremia   . Shoulder joint dislocation     Past Surgical History  Procedure Laterality Date  . Cholecystectomy    . Appendectomy      Family History  Problem Relation Age of Onset  . Colon cancer Neg Hx   . Stomach cancer Neg Hx   . Kidney disease Mother     History   Social History  . Marital Status: Married    Spouse Name: N/A    Number of Children: N/A  . Years of Education: N/A   Occupational History  . Not on file.   Social History Main Topics  . Smoking status: Never Smoker   . Smokeless tobacco: Never Used  . Alcohol Use: No  . Drug Use: No  . Sexual Activity: Not on file   Other Topics Concern  . Not on file   Social History Narrative  . No narrative on file    Review of systems: The patient specifically denies any chest pain at rest or with exertion, dyspnea at rest or with exertion, orthopnea, paroxysmal nocturnal dyspnea, syncope, palpitations, focal neurological deficits, intermittent claudication, lower extremity edema, unexplained weight gain, cough,  hemoptysis or wheezing.  The patient also denies abdominal pain, nausea, vomiting, dysphagia, diarrhea, constipation, polyuria, polydipsia, dysuria, hematuria, frequency, urgency, abnormal bleeding or bruising, fever, chills, unexpected weight changes, mood swings, change in skin or hair texture, change in voice quality, auditory or visual problems, allergic reactions or rashes, new musculoskeletal complaints other than usual "aches and pains".   General: Alert, oriented x3, no distress  Head: no evidence of trauma, PERRL, EOMI, no exophtalmos or lid lag, no myxedema, no xanthelasma; normal ears, nose and oropharynx  Neck: normal jugular venous pulsations and no hepatojugular reflux; brisk carotid pulses without delay and no carotid bruits  Chest: clear to  auscultation, no signs of consolidation by percussion or palpation, normal fremitus, symmetrical and full respiratory excursions; healing zoster eruption  Cardiovascular: normal position and quality of the apical impulse, regular rhythm, normal first and second heart sounds, no murmurs, rubs or gallops  Abdomen: no tenderness or distention, no masses by palpation, no abnormal pulsatility or arterial bruits, normal bowel sounds, no hepatosplenomegaly  Extremities: no clubbing, cyanosis or edema; 2+ radial, ulnar and brachial pulses bilaterally; 2+ right femoral, posterior tibial and dorsalis pedis pulses; 2+ left femoral, posterior tibial and dorsalis pedis pulses; no subclavian or femoral bruits  Neurological: grossly nonfocal  EKG: Normal sinus rhythm, normal tracing   PHYSICAL EXAM BP 132/68  Pulse 80  Resp 16  Ht 5' 2.5" (1.588 m)  Wt 143 lb 1.6 oz (64.91 kg)  BMI 25.74 kg/m2 General: Alert, oriented x3, no distress  Head: no evidence of trauma, PERRL, EOMI, no exophtalmos or lid lag, no myxedema, no xanthelasma; normal ears, nose and oropharynx  Neck: normal jugular venous pulsations and no hepatojugular reflux; brisk carotid pulses without delay and no carotid bruits  Chest: clear to auscultation, no signs of consolidation by percussion or palpation, normal fremitus, symmetrical and full respiratory excursions; healing zoster eruption  Cardiovascular: normal position and quality of the apical impulse, regular rhythm, normal first and second heart sounds, no murmurs, rubs or gallops  Abdomen: no tenderness or distention, no masses by palpation, no abnormal pulsatility or arterial bruits, normal bowel sounds, no hepatosplenomegaly  Extremities: no clubbing, cyanosis or edema; 2+ radial, ulnar and brachial pulses bilaterally; 2+ right femoral, posterior tibial and dorsalis pedis pulses; 2+ left femoral, posterior tibial and dorsalis pedis pulses; no subclavian or femoral bruits  Neurological:  grossly nonfocal   EKG: Normal sinus rhythm, normal tracing  Lipid Panel     Component Value Date/Time   CHOL 264* 08/28/2013 1630   TRIG 72 08/28/2013 1630   HDL 83 08/28/2013 1630   CHOLHDL 3.2 08/28/2013 1630   VLDL 14 08/28/2013 1630   LDLCALC 167* 08/28/2013 1630    BMET    Component Value Date/Time   NA 138 08/28/2013 1627   K 4.0 08/28/2013 1627   CL 105 08/28/2013 1627   CO2 28 08/28/2013 1627   GLUCOSE 95 08/28/2013 1627   BUN 11 08/28/2013 1627   CREATININE 0.83 08/28/2013 1627   CREATININE 0.70 03/24/2012 1321   CALCIUM 9.6 08/28/2013 1627     ASSESSMENT AND PLAN  Her blood pressure is now well controlled. Despite elevated LDL C levels, her HDL is excellent and she does not have known vascular disease. A weak statin such as pravastatin is appropriate. Interrupting statin therapy has not led to beneficial changes in her memory. I have recommended she undergo a MMSE with her primary care doctor, but her husband would like a neurological evaluation.  Orders Placed This Encounter  Procedures  . Comp Met (CMET)  . Lipid Profile  . Ambulatory referral to Neurology  . EKG 12-Lead   Meds ordered this encounter  Medications  . pravastatin (PRAVACHOL) 40 MG tablet    Sig: Take 1 tablet (40 mg total) by mouth every evening.    Dispense:  90 tablet    Refill:  Pocola Niyanna Asch, MD, [facility] [phone] office [phone] pager

## 2013-12-16 ENCOUNTER — Ambulatory Visit: Payer: Medicare HMO | Admitting: Cardiovascular Disease

## 2013-12-25 ENCOUNTER — Encounter: Payer: Self-pay | Admitting: Cardiovascular Disease

## 2013-12-25 ENCOUNTER — Ambulatory Visit (INDEPENDENT_AMBULATORY_CARE_PROVIDER_SITE_OTHER): Payer: Medicare HMO | Admitting: Cardiovascular Disease

## 2013-12-25 VITALS — BP 142/71 | HR 82 | Resp 20 | Ht 62.5 in | Wt 146.1 lb

## 2013-12-25 DIAGNOSIS — E785 Hyperlipidemia, unspecified: Secondary | ICD-10-CM

## 2013-12-25 NOTE — Progress Notes (Signed)
Patient ID: Judy Caldwell, female   DOB: 1941-07-12, 73 y.o.   MRN: [id number]  Mrs. Budzik came today for followup but I don't think we had really planned to see her back so soon. Her blood pressure control remains good. She has a followup appointment scheduled with a neurologist (Dr. Leta Baptist) to evaluate her memory problems, coming up in the next few days. We are waiting for the results of her follow up lipid profile, not available at this time.  It is important to note that she had her lipid profile drawn today at 11 AM, just 4 hours ago. Confirm this by calling the lab. When I asked her when the labs were done she told me "I can't remember: it was either yesterday or the day before". This does appear to support the family suspicion that she has deteriorating memory.

## 2013-12-26 LAB — COMPREHENSIVE METABOLIC PANEL
ALT: 11 U/L (ref 0–35)
AST: 19 U/L (ref 0–37)
Albumin: 4.3 g/dL (ref 3.5–5.2)
Alkaline Phosphatase: 49 U/L (ref 39–117)
BUN: 14 mg/dL (ref 6–23)
CO2: 27 mEq/L (ref 19–32)
Calcium: 9.2 mg/dL (ref 8.4–10.5)
Chloride: 106 mEq/L (ref 96–112)
Creat: 0.68 mg/dL (ref 0.50–1.10)
Glucose, Bld: 88 mg/dL (ref 70–99)
Potassium: 3.8 mEq/L (ref 3.5–5.3)
Sodium: 141 mEq/L (ref 135–145)
Total Bilirubin: 0.6 mg/dL (ref 0.3–1.2)
Total Protein: 7 g/dL (ref 6.0–8.3)

## 2013-12-26 LAB — LIPID PANEL
Cholesterol: 189 mg/dL (ref 0–200)
HDL: 76 mg/dL (ref >39)
LDL Cholesterol: 101 mg/dL — ABNORMAL HIGH (ref 0–99)
Total CHOL/HDL Ratio: 2.5 Ratio
Triglycerides: 61 mg/dL (ref <150)
VLDL: 12 mg/dL (ref 0–40)

## 2013-12-30 ENCOUNTER — Ambulatory Visit (INDEPENDENT_AMBULATORY_CARE_PROVIDER_SITE_OTHER): Payer: Medicare HMO | Admitting: Diagnostic Neuroimaging

## 2013-12-30 ENCOUNTER — Encounter: Payer: Self-pay | Admitting: Diagnostic Neuroimaging

## 2013-12-30 ENCOUNTER — Encounter (INDEPENDENT_AMBULATORY_CARE_PROVIDER_SITE_OTHER): Payer: Self-pay

## 2013-12-30 VITALS — BP 113/75 | HR 72 | Temp 97.6°F | Ht 62.5 in | Wt 144.5 lb

## 2013-12-30 DIAGNOSIS — R413 Other amnesia: Secondary | ICD-10-CM

## 2013-12-30 MED ORDER — SERTRALINE HCL 25 MG PO TABS: 25.0000 mg | ORAL_TABLET | Freq: Every day | ORAL | Status: DC

## 2013-12-30 NOTE — Patient Instructions (Signed)
Try sertraline 25mg  daily for mood.  I will check MRI and lab testing.

## 2013-12-30 NOTE — Progress Notes (Signed)
[facility]  PATIENT: Judy Caldwell DOB: Mar 09, 1941  REFERRING CLINICIAN: Croitoru HISTORY FROM: patient and daughter REASON FOR VISIT: new consult   HISTORICAL  CHIEF COMPLAINT:  Chief Complaint  Patient presents with  . Memory Loss    HISTORY OF PRESENT ILLNESS:   73 year old right-handed female here for evaluation of memory loss. Patient is accompanied by her daughter for this visit.  According to the patient she has not noticed any significant memory problems. She has some mild memory problems that she feels are normal for her age. Patient lives with her husband and she is able to maintain most of her activities daily living. Husband does most of the driving and cooking, which has not changed. Patient still able to balance her own checkbook and pays her own bills. Patient is a retired Pharmacist, hospital, Airline pilot, and graduated with bachelors and masters degrees.  According to daughter over past one to 2 years patient has had progressive short-term memory problems. She is repeating herself increasingly and often gets stuck in a "loop". Patient asked the question, receive an answer from a friend or family member, and asked the same question again 2 minutes later. This loop may continue dozens or 100s of times a day. Symptoms are worse if patient is frazzled, agitated or frustrated. Patient has become increasingly angry and defensive regarding her symptoms. This is caused significant stress between family members.  Also of note patient has had increasing problems with stumbling and stooped posture.  REVIEW OF SYSTEMS: Full 14 system review of systems performed and notable only for weight loss spinning sensations knowing joint pain aching muscles depression on of sleep decreased energy change in appetite disinterest in activities memory loss confusion insomnia.  ALLERGIES: Allergies  Allergen Reactions  . Latex Hives  . Advil [Ibuprofen] Nausea Only  .  Celebrex [Celecoxib] Nausea Only  . Eggs Or Egg-Derived Products Swelling    THROAT CLOSES  . Influenza Vac Split [Flu Virus Vaccine] Other (See Comments)    Throat swells    HOME MEDICATIONS: Outpatient Prescriptions Prior to Visit  Medication Sig Dispense Refill  . amLODipine (NORVASC) 10 MG tablet Take 1 tablet (10 mg total) by mouth daily.  30 tablet  10  . calcium-vitamin D (OSCAL WITH D) 500-200 MG-UNIT per tablet Take 1 tablet by mouth daily.      Marland Kitchen co-enzyme Q-10 30 MG capsule Take 30 mg by mouth 3 (three) times daily.      . COCONUT OIL PO Take by mouth 2 (two) times daily.      Marland Kitchen gabapentin (NEURONTIN) 100 MG capsule Take 100 mg by mouth as needed. Take 1 cap daily for 2 days , 2 caps daily for 2 days, then 3 caps daily      . pravastatin (PRAVACHOL) 40 MG tablet Take 1 tablet (40 mg total) by mouth every evening.  90 tablet  3  . valACYclovir (VALTREX) 1000 MG tablet as needed. Take one tablet 3 times a day for one week      . vitamin B-12 (CYANOCOBALAMIN) 100 MCG tablet Take 1,000 mcg by mouth daily.        No facility-administered medications prior to visit.    PAST MEDICAL HISTORY: Past Medical History  Diagnosis Date  . Hypertension   . Hypercholesteremia   . Shoulder joint dislocation     PAST SURGICAL HISTORY: Past Surgical History  Procedure Laterality Date  . Cholecystectomy    . Appendectomy  FAMILY HISTORY: Family History  Problem Relation Age of Onset  . Colon cancer Neg Hx   . Stomach cancer Neg Hx   . Kidney disease Mother     SOCIAL HISTORY:  History   Social History  . Marital Status: Married    Spouse Name: Chrissie Noa    Number of Children: 3  . Years of Education: BS/MA   Occupational History  . Retired    Social History Main Topics  . Smoking status: Never Smoker   . Smokeless tobacco: Never Used  . Alcohol Use: No  . Drug Use: No  . Sexual Activity: Not on file   Other Topics Concern  . Not on file   Social History  Narrative   Patient lives at home with spouse.   Caffeine Use: chocolate     PHYSICAL EXAM  Filed Vitals:   12/30/13 1116  BP: 113/75  Pulse: 72  Temp: 97.6 F (36.4 C)  TempSrc: Oral  Height: 5' 2.5" (1.588 m)  Weight: 144 lb 8 oz (65.545 kg)    Not recorded    Body mass index is 25.99 kg/(m^2).  GENERAL EXAM: Patient is in no distress; well developed, nourished and groomed; neck is supple  CARDIOVASCULAR: Regular rate and rhythm, no murmurs, no carotid bruits  NEUROLOGIC: MENTAL STATUS: awake, alert, DENIES MEMORY ISSUES. MMSE 24/30. ORIENTED TO "November 30, 1214; Monday, [place], [place], [place]". REGISTERS 3/3. RECALLS 1/3. Language fluent, comprehension intact, naming intact, fund of knowledge appropriate. AFT 8. GDS 0. PALMOMENTAL, SNOUT, MYERSONS NEG.  CRANIAL NERVE: no papilledema on fundoscopic exam, pupils equal and reactive to light, visual fields full to confrontation, extraocular muscles intact, no nystagmus, facial sensation and strength symmetric, hearing intact, palate elevates symmetrically, uvula midline, shoulder shrug symmetric, tongue midline. MOTOR: normal bulk and tone, full strength in the BUE, BLE SENSORY: normal and symmetric to light touch, pinprick, temperature, vibration COORDINATION: finger-nose-finger, fine finger movements normal REFLEXES: deep tendon reflexes present and symmetric GAIT/STATION: narrow based gait; STOOPED POSTURE. UNSTEADY. SLOW TO RISE AND WALK. HAS SINGLE POINT CANE.     DIAGNOSTIC DATA (LABS, IMAGING, TESTING) - I reviewed patient records, labs, notes, testing and imaging myself where available.  Lab Results  Component Value Date   WBC 6.5 03/24/2012   HGB 13.3 03/24/2012   HCT 39.0 03/24/2012   MCV 91.1 03/24/2012   PLT 269 07/22/2011      Component Value Date/Time   NA 141 12/25/2013 1111   K 3.8 12/25/2013 1111   CL 106 12/25/2013 1111   CO2 27 12/25/2013 1111   GLUCOSE 88 12/25/2013 1111   BUN 14 12/25/2013 1111    CREATININE 0.68 12/25/2013 1111   CREATININE 0.70 03/24/2012 1321   CALCIUM 9.2 12/25/2013 1111   PROT 7.0 12/25/2013 1111   ALBUMIN 4.3 12/25/2013 1111   AST 19 12/25/2013 1111   ALT 11 12/25/2013 1111   ALKPHOS 49 12/25/2013 1111   BILITOT 0.6 12/25/2013 1111   Lab Results  Component Value Date   CHOL 189 12/25/2013   HDL 76 12/25/2013   LDLCALC 101* 12/25/2013   TRIG 61 12/25/2013   CHOLHDL 2.5 12/25/2013   Lab Results  Component Value Date   HGBA1C 5.0 07/31/2012   No results found for this basename: VITAMINB12   No results found for this basename: TSH     ASSESSMENT AND PLAN  73 y.o. year old female here with progressive short-term memory problems her past one to 2 years. Also with poor insight,  more agitation, insomnia and mood changes. Symptoms and exam findings are suspicious for mild dementia. Neuropsychiatric symptoms are most troubling for patient and daughter at this time.  Ddx: mild dementia, metabolic, structural, vascular  PLAN: - sertraline for mood stabilization first - then consider donepezil in next few months - addl testing as below   Orders Placed This Encounter  Procedures  . MR Brain Wo Contrast  . Vitamin B12  . TSH    Meds ordered this encounter  Medications  . sertraline (ZOLOFT) 25 MG tablet    Sig: Take 1 tablet (25 mg total) by mouth daily.    Dispense:  30 tablet    Refill:  12    Return in about 3 months (around 03/30/2014).    Penni Bombard, MD 4/78/2956, 2:13 PM Certified in Neurology, Neurophysiology and Neuroimaging  [facility] [address] [place], [place] [address] [phone]

## 2013-12-31 LAB — VITAMIN B12

## 2013-12-31 LAB — TSH: TSH: 1.01 u[IU]/mL (ref 0.450–4.500)

## 2014-01-10 DIAGNOSIS — R413 Other amnesia: Secondary | ICD-10-CM | POA: Insufficient documentation

## 2014-01-13 ENCOUNTER — Ambulatory Visit
Admission: RE | Admit: 2014-01-13 | Discharge: 2014-01-13 | Disposition: A | Discharge status: Home / Self Care | Payer: Medicare HMO | Source: Ambulatory Visit | Attending: Diagnostic Neuroimaging | Admitting: Diagnostic Neuroimaging

## 2014-01-13 DIAGNOSIS — R413 Other amnesia: Secondary | ICD-10-CM

## 2014-03-10 ENCOUNTER — Telehealth: Payer: Self-pay | Admitting: Diagnostic Neuroimaging

## 2014-03-10 NOTE — Telephone Encounter (Signed)
Last OV says: PLAN:  - sertraline for mood stabilization first  - then consider donepezil in next few months Okay for patient to start Aricept at this time?  Please advise.  Thank you.

## 2014-03-10 NOTE — Telephone Encounter (Signed)
Calling to get patient's Aricept called in. States she wants the first RX as they discuss [facility] [address] and all following to [facility] in [place]. She is staying with her daughter for this first set to be monitors

## 2014-03-13 NOTE — Telephone Encounter (Signed)
Pt's daughter called in to check on the status of the Aricept prescription.  She asked if someone could call her back and let her know when [facility] will [facility] the [facility]. She wants to be with her mother when she takes it so she can look for any side effects.  Please call.   Thank you

## 2014-03-14 ENCOUNTER — Telehealth: Payer: Self-pay

## 2014-03-14 NOTE — Telephone Encounter (Signed)
Patient's daughter still waiting on Aricept Rx to be called in [facility] in [place] [phone] not called in today please call Rx to [facility] on [address] in [facility] call patient and advise--thank you.

## 2014-03-14 NOTE — Telephone Encounter (Signed)
I spoke with Ms. Judy Caldwell and she said the mood stabilizer has helped greatly.  They would now like a Rx for Aricept.   Last OV says:  PLAN:  - sertraline for mood stabilization first  - then consider donepezil in next few months  WID, okay for patient to start Aricept at this time? Please advise. Thank you.

## 2014-03-14 NOTE — Telephone Encounter (Signed)
I called back.  Got no answer.  Left message.  

## 2014-03-14 NOTE — Telephone Encounter (Signed)
Since Dr. Leta Baptist knows the patient better, I would recommend that they discuss this during her appointment which is scheduled for later this month.

## 2014-03-17 NOTE — Telephone Encounter (Signed)
I will address at next office visit later this month. -VRP

## 2014-03-25 ENCOUNTER — Telehealth: Payer: Self-pay | Admitting: Diagnostic Neuroimaging

## 2014-03-25 NOTE — Telephone Encounter (Signed)
Please refer to note from 04/10.  We called back and got no answer, a message was left.   Also see message noted:  Penni Bombard, MD at 03/17/2014 5:53 PM     Status: Signed        I will address at next office visit later this month. -VRP   I called back.  Spoke with Mr Loyal.  He is aware this will be discussed at [place] on 04/28.

## 2014-03-25 NOTE — Telephone Encounter (Signed)
Patient's daughter called to state that they have not received any calls regarding the generic script of Aricept, patient's daughter is upset and instructed [place] to call patient's husband at the number listed. Please call and advise.

## 2014-04-01 ENCOUNTER — Encounter (INDEPENDENT_AMBULATORY_CARE_PROVIDER_SITE_OTHER): Payer: Self-pay

## 2014-04-01 ENCOUNTER — Ambulatory Visit (INDEPENDENT_AMBULATORY_CARE_PROVIDER_SITE_OTHER): Payer: Medicare HMO | Admitting: Diagnostic Neuroimaging

## 2014-04-01 ENCOUNTER — Encounter: Payer: Self-pay | Admitting: Diagnostic Neuroimaging

## 2014-04-01 VITALS — BP 123/80 | HR 80 | Ht 62.0 in | Wt 144.0 lb

## 2014-04-01 DIAGNOSIS — R413 Other amnesia: Secondary | ICD-10-CM

## 2014-04-01 MED ORDER — DONEPEZIL HCL 10 MG PO TABS: 10.0000 mg | ORAL_TABLET | Freq: Every day | ORAL | Status: DC

## 2014-04-01 MED ORDER — DONEPEZIL HCL 5 MG PO TABS: 5.0000 mg | ORAL_TABLET | Freq: Every day | ORAL | Status: DC

## 2014-04-01 NOTE — Progress Notes (Signed)
[facility]  PATIENT: Judy Caldwell DOB: [birth year]  REFERRING CLINICIAN:  HISTORY FROM: patient and husband REASON FOR VISIT: follow up   HISTORICAL  CHIEF COMPLAINT:  Chief Complaint  Patient presents with  . Memory Loss    f/u, Rm 6    HISTORY OF PRESENT ILLNESS:   UPDATE 04/01/14: Since last visit patient is doing better with her mood on sertraline. Patient is accompanied by husband today for this visit. Memory is stable. Patient and husband are somewhat argumentative during my exam, but intermixed with joking.  PRIOR HPI (12/30/13): 73 year old right-handed female here for evaluation of memory loss. Patient is accompanied by her daughter for this visit.  According to the patient she has not noticed any significant memory problems. She has some mild memory problems that she feels are normal for her age. Patient lives with her husband and she is able to maintain most of her activities daily living. Husband does most of the driving and cooking, which has not changed. Patient still able to balance her own checkbook and pays her own bills. Patient is a retired Pharmacist, hospital, Airline pilot, and graduated with bachelors and masters degrees.  According to daughter over past one to 2 years patient has had progressive short-term memory problems. She is repeating herself increasingly and often gets stuck in a "loop". Patient asked the question, receive an answer from a friend or family member, and asked the same question again 2 minutes later. This loop may continue dozens or 100s of times a day. Symptoms are worse if patient is frazzled, agitated or frustrated. Patient has become increasingly angry and defensive regarding her symptoms. This is caused significant stress between family members.  Also of note patient has had increasing problems with stumbling and stooped posture.  REVIEW OF SYSTEMS: Full 14 system review of systems performed and notable only for joint  pain and walking difficulty food allergy.   ALLERGIES: Allergies  Allergen Reactions  . Latex Hives  . Advil [Ibuprofen] Nausea Only  . Celebrex [Celecoxib] Nausea Only  . Eggs Or Egg-Derived Products Swelling    THROAT CLOSES  . Influenza Vac Split [Flu Virus Vaccine] Other (See Comments)    Throat swells    HOME MEDICATIONS: Outpatient Prescriptions Prior to Visit  Medication Sig Dispense Refill  . amLODipine (NORVASC) 10 MG tablet Take 1 tablet (10 mg total) by mouth daily.  30 tablet  10  . calcium-vitamin D (OSCAL WITH D) 500-200 MG-UNIT per tablet Take 1 tablet by mouth daily.      Marland Kitchen co-enzyme Q-10 30 MG capsule Take 30 mg by mouth 3 (three) times daily.      . COCONUT OIL PO Take by mouth 2 (two) times daily.      Marland Kitchen gabapentin (NEURONTIN) 100 MG capsule Take 100 mg by mouth as needed. Take 1 cap daily for 2 days , 2 caps daily for 2 days, then 3 caps daily      . pravastatin (PRAVACHOL) 40 MG tablet Take 1 tablet (40 mg total) by mouth every evening.  90 tablet  3  . sertraline (ZOLOFT) 25 MG tablet Take 1 tablet (25 mg total) by mouth daily.  30 tablet  12  . valACYclovir (VALTREX) 1000 MG tablet as needed. Take one tablet 3 times a day for one week      . vitamin B-12 (CYANOCOBALAMIN) 100 MCG tablet Take 1,000 mcg by mouth daily.        No facility-administered medications prior to  visit.    PAST MEDICAL HISTORY: Past Medical History  Diagnosis Date  . Hypertension   . Hypercholesteremia   . Shoulder joint dislocation     PAST SURGICAL HISTORY: Past Surgical History  Procedure Laterality Date  . Cholecystectomy    . Appendectomy      FAMILY HISTORY: Family History  Problem Relation Age of Onset  . Colon cancer Neg Hx   . Stomach cancer Neg Hx   . Kidney disease Mother     SOCIAL HISTORY:  History   Social History  . Marital Status: Married    Spouse Name: Chrissie Noa    Number of Children: 3  . Years of Education: BS/MA   Occupational History  .  Retired    Social History Main Topics  . Smoking status: Never Smoker   . Smokeless tobacco: Never Used  . Alcohol Use: No  . Drug Use: No  . Sexual Activity: Not on file   Other Topics Concern  . Not on file   Social History Narrative   Patient lives at home with spouse.   Caffeine Use: chocolate     PHYSICAL EXAM  Filed Vitals:   04/01/14 1442  BP: 123/80  Pulse: 80  Height: 5\' 2"  (1.575 m)  Weight: 144 lb (65.318 kg)    Not recorded    Body mass index is 26.33 kg/(m^2).  GENERAL EXAM: Patient is in no distress; well developed, nourished and groomed; neck is supple  CARDIOVASCULAR: Regular rate and rhythm, no murmurs, no carotid bruits  NEUROLOGIC: MENTAL STATUS: awake, alert, DENIES MEMORY ISSUES. SOMEWHAT DEFENSIVE. Language fluent, comprehension intact, naming intact, fund of knowledge appropriate. MMSE 29/30. CRANIAL NERVE: no papilledema on fundoscopic exam, pupils equal and reactive to light, visual fields full to confrontation, extraocular muscles intact, no nystagmus, facial sensation and strength symmetric, hearing intact, palate elevates symmetrically, uvula midline, shoulder shrug symmetric, tongue midline. MOTOR: normal bulk and tone, full strength in the BUE, BLE SENSORY: normal and symmetric to light touch, pinprick, temperature, vibration COORDINATION: finger-nose-finger, fine finger movements normal REFLEXES: deep tendon reflexes present and symmetric GAIT/STATION: narrow based gait; STOOPED POSTURE. UNSTEADY. SLOW TO RISE AND WALK. HAS SINGLE POINT CANE.     DIAGNOSTIC DATA (LABS, IMAGING, TESTING) - I reviewed patient records, labs, notes, testing and imaging myself where available.  Lab Results  Component Value Date   WBC 6.5 03/24/2012   HGB 13.3 03/24/2012   HCT 39.0 03/24/2012   MCV 91.1 03/24/2012   PLT 269 07/22/2011      Component Value Date/Time   NA 141 12/25/2013 1111   K 3.8 12/25/2013 1111   CL 106 12/25/2013 1111   CO2 27  12/25/2013 1111   GLUCOSE 88 12/25/2013 1111   BUN 14 12/25/2013 1111   CREATININE 0.68 12/25/2013 1111   CREATININE 0.70 03/24/2012 1321   CALCIUM 9.2 12/25/2013 1111   PROT 7.0 12/25/2013 1111   ALBUMIN 4.3 12/25/2013 1111   AST 19 12/25/2013 1111   ALT 11 12/25/2013 1111   ALKPHOS 49 12/25/2013 1111   BILITOT 0.6 12/25/2013 1111   Lab Results  Component Value Date   CHOL 189 12/25/2013   HDL 76 12/25/2013   LDLCALC 101* 12/25/2013   TRIG 61 12/25/2013   CHOLHDL 2.5 12/25/2013   Lab Results  Component Value Date   HGBA1C 5.0 07/31/2012   Lab Results  Component Value Date   VITAMINB12 >1999* 12/30/2013   Lab Results  Component Value Date   TSH 1.010  12/30/2013    I reviewed images myself and agree with interpretation. -VRP  01/13/14 MRI brain (without) demonstrating:  1. Mild periventricular and subcortical foci of non-specific gliosis.  2. No acute findings.   ASSESSMENT AND PLAN  73 y.o. year old female here with progressive short-term memory problems since 2013. Also with poor insight, agitation, insomnia and mood changes. Symptoms and exam findings are suspicious for mild dementia. Neuropsychiatric symptoms and memory testing have improved on sertraline. Reported memory at home remains poor.  MMSE  12/30/13 - 24/30, AFT 8 04/02/14 - 29/30  Ddx: MCI vs mild dementia  PLAN: - continue sertraline for mood stabilization - start donepezil 5mg  qhs x 1 month, then 10mg  qhs  Meds ordered this encounter  Medications  . donepezil (ARICEPT) 5 MG tablet    Sig: Take 1 tablet (5 mg total) by mouth at bedtime.    Dispense:  30 tablet    Refill:  0  . donepezil (ARICEPT) 10 MG tablet    Sig: Take 1 tablet (10 mg total) by mouth at bedtime.    Dispense:  30 tablet    Refill:  12    Return in about 6 months (around 10/01/2014).    Penni Bombard, MD 04/13/2584, 2:77 PM Certified in Neurology, Neurophysiology and Neuroimaging  [facility] [address]  [place], [place] [address] [phone]

## 2014-04-01 NOTE — Patient Instructions (Signed)
Start donepezil 5mg  at bedtime for 1 month, then increase to 10mg  at bedtime.

## 2014-04-14 ENCOUNTER — Telehealth: Payer: Self-pay | Admitting: Diagnostic Neuroimaging

## 2014-04-14 NOTE — Telephone Encounter (Signed)
Patient's husband calling to state that Dr. Leta Baptist said he would start her off with 5 mg of Donepezil and then move up to 10 mg. Patient's husband states that the script they picked up is 10 mg. Please call and advise.

## 2014-04-14 NOTE — Telephone Encounter (Signed)
Rx was sent for 5mg .  I called the pharmacy.  Spoke with the pharmacist.  She said they did get the 5mg  Rx, but for some reason it was saved to the patient file rather than being filled.  They will fill the Rx now.  I called the patient back.  They are aware.

## 2014-07-26 ENCOUNTER — Ambulatory Visit (INDEPENDENT_AMBULATORY_CARE_PROVIDER_SITE_OTHER): Payer: Medicare HMO | Admitting: Emergency Medicine

## 2014-07-26 VITALS — BP 130/62 | HR 65 | Temp 97.8°F | Resp 16 | Ht 62.0 in | Wt 126.5 lb

## 2014-07-26 DIAGNOSIS — IMO0001 Reserved for inherently not codable concepts without codable children: Secondary | ICD-10-CM

## 2014-07-26 DIAGNOSIS — M791 Myalgia, unspecified site: Secondary | ICD-10-CM

## 2014-07-26 DIAGNOSIS — R634 Abnormal weight loss: Secondary | ICD-10-CM

## 2014-07-26 DIAGNOSIS — M6281 Muscle weakness (generalized): Secondary | ICD-10-CM

## 2014-07-26 DIAGNOSIS — F0390 Unspecified dementia without behavioral disturbance: Secondary | ICD-10-CM

## 2014-07-26 LAB — POCT CBC
Granulocyte percent: 55.2 %G (ref 37–80)
HCT, POC: 40.3 % (ref 37.7–47.9)
Hemoglobin: 13.4 g/dL (ref 12.2–16.2)
Lymph, poc: 1.8 (ref 0.6–3.4)
MCH: 30.6 pg (ref 27–31.2)
MCHC: 33.3 g/dL (ref 31.8–35.4)
MCV: 91.9 fL (ref 80–97)
MID (CBC): 0.3 (ref 0–0.9)
MPV: 7.7 fL (ref 0–99.8)
POC Granulocyte: 2.6 (ref 2–6.9)
POC LYMPH %: 37.4 % (ref 10–50)
POC MID %: 7.4 %M (ref 0–12)
Platelet Count, POC: 308 10*3/uL (ref 142–424)
RBC: 4.39 M/uL (ref 4.04–5.48)
RDW, POC: 14.1 %
WBC: 4.7 10*3/uL (ref 4.6–10.2)

## 2014-07-26 LAB — COMPLETE METABOLIC PANEL WITH GFR
ALK PHOS: 60 U/L (ref 39–117)
ALT: 11 U/L (ref 0–35)
AST: 22 U/L (ref 0–37)
Albumin: 4.4 g/dL (ref 3.5–5.2)
BILIRUBIN TOTAL: 0.8 mg/dL (ref 0.2–1.2)
BUN: 13 mg/dL (ref 6–23)
CO2: 26 mEq/L (ref 19–32)
Calcium: 9.7 mg/dL (ref 8.4–10.5)
Chloride: 104 mEq/L (ref 96–112)
Creat: 0.89 mg/dL (ref 0.50–1.10)
GFR, Est African American: 74 mL/min
GFR, Est Non African American: 65 mL/min
Glucose, Bld: 88 mg/dL (ref 70–99)
Potassium: 3.7 mEq/L (ref 3.5–5.3)
Sodium: 141 mEq/L (ref 135–145)
Total Protein: 7.5 g/dL (ref 6.0–8.3)

## 2014-07-26 LAB — CK: Total CK: 99 U/L (ref 7–177)

## 2014-07-26 LAB — POCT SEDIMENTATION RATE: POCT SED RATE: 24 mm/hr — AB (ref 0–22)

## 2014-07-26 LAB — TSH: TSH: 1.024 u[IU]/mL (ref 0.350–4.500)

## 2014-07-26 MED ORDER — MEGESTROL ACETATE 400 MG/10ML PO SUSP: 400.0000 mg | Freq: Every day | ORAL | Status: DC

## 2014-07-26 NOTE — Progress Notes (Signed)
Subjective:  This chart was scribed for Arlyss Queen, MD by Donato Schultz, Medical Scribe. This patient was seen in Room 11 and the patient's care was started at 1:00 PM.   Patient ID: Judy Caldwell, female    DOB: 27-Feb-1941, 73 y.o.   MRN: [id number]  HPI HPI Comments: Judy Caldwell is a 73 y.o. female who presents to the [facility] complaining of gradually worsening weight loss with associated appetite loss and decreased activity.  Her daughter states that the patient's symptoms started shortly after she was diagnosed with dementia a couple of months ago.  She is living with her husband and will not eat the 3 meals her husband prepares daily.  Her daughter believes that Zoloft has aggravated her depression and anxiety.  She has been giving her mother Xanax intermittently to relieve her anxiety.  She walks with a cane.     Past Medical History  Diagnosis Date  . Hypertension   . Hypercholesteremia   . Shoulder joint dislocation    Past Surgical History  Procedure Laterality Date  . Cholecystectomy    . Appendectomy     Family History  Problem Relation Age of Onset  . Colon cancer Neg Hx   . Stomach cancer Neg Hx   . Kidney disease Mother    History   Social History  . Marital Status: Married    Spouse Name: Chrissie Noa    Number of Children: 3  . Years of Education: BS/MA   Occupational History  . Retired    Social History Main Topics  . Smoking status: Never Smoker   . Smokeless tobacco: Never Used  . Alcohol Use: No  . Drug Use: No  . Sexual Activity: Not on file   Other Topics Concern  . Not on file   Social History Narrative   Patient lives at home with spouse.   Caffeine Use: chocolate   Allergies  Allergen Reactions  . Latex Hives  . Advil [Ibuprofen] Nausea Only  . Celebrex [Celecoxib] Nausea Only  . Eggs Or Egg-Derived Products Swelling    THROAT CLOSES  . Influenza Vac Split [Flu Virus Vaccine] Other (See Comments)   Throat swells    Review of Systems  Constitutional: Positive for activity change and appetite change.  Musculoskeletal: Positive for arthralgias.     Objective:  Physical Exam  Nursing note and vitals reviewed. Constitutional: She is oriented to person, place, and time. She appears well-developed and well-nourished.  Frail, elderly female who is not in any distress.  HENT:  Head: Normocephalic and atraumatic.  Eyes: EOM are normal.  Neck: Normal range of motion. Neck supple. No thyromegaly present.  Cardiovascular: Normal rate, regular rhythm and normal heart sounds.  Exam reveals no gallop and no friction rub.   No murmur heard. Pulmonary/Chest: Effort normal and breath sounds normal. No respiratory distress. She has no wheezes. She has no rales.  Musculoskeletal: Normal range of motion. She exhibits no edema.  Lower extremity muscle wasting.  Lymphadenopathy:    She has no cervical adenopathy.  Neurological: She is alert and oriented to person, place, and time.  Skin: Skin is warm and dry.  Psychiatric: She has a normal mood and affect. Her behavior is normal.   Results for orders placed in visit on 07/26/14  POCT CBC      Result Value Ref Range   WBC 4.7  4.6 - 10.2 K/uL   Lymph, poc 1.8  0.6 - 3.4  POC LYMPH PERCENT 37.4  10 - 50 %L   MID (cbc) 0.3  0 - 0.9   POC MID % 7.4  0 - 12 %M   POC Granulocyte 2.6  2 - 6.9   Granulocyte percent 55.2  37 - 80 %G   RBC 4.39  4.04 - 5.48 M/uL   Hemoglobin 13.4  12.2 - 16.2 g/dL   HCT, POC 40.3  37.7 - 47.9 %   MCV 91.9  80 - 97 fL   MCH, POC 30.6  27 - 31.2 pg   MCHC 33.3  31.8 - 35.4 g/dL   RDW, POC 14.1     Platelet Count, POC 308  142 - 424 K/uL   MPV 7.7  0 - 99.8 fL     BP 130/62  Pulse 65  Temp(Src) 97.8 F (36.6 C) (Oral)  Resp 16  Ht 5\' 2"  (1.575 m)  Wt 126 lb 8 oz (57.38 kg)  BMI 23.13 kg/m2  SpO2 100% Assessment & Plan:  Patient declines flu shot. Patient declines Prevnar. Referral made to call how her  in physical therapy to work on strengthening of the upper and lower extremities. We'll try Megace to see if we can help with her appetite  I personally performed the services described in this documentation, which was scribed in my presence. The recorded information has been reviewed and is accurate.

## 2014-07-31 ENCOUNTER — Telehealth: Payer: Self-pay

## 2014-07-31 NOTE — Telephone Encounter (Signed)
PA needed for Megestrol. Checked w/Debbie and Dr Ouida Sills, there is no appropriate alternative for increasing pt's appetite. Completed covermymeds. Pending.

## 2014-08-06 NOTE — Telephone Encounter (Signed)
Dr Everlene Farrier, PA was denied d/t it is being Rxd for a Dx is not a "medically accepted indication as stated " in the Social Security Act. Dr Everlene Farrier, do you want to Rx anything else?

## 2014-08-06 NOTE — Telephone Encounter (Signed)
Call her daughter and let her know Medicare will not pay for the Megace.

## 2014-08-07 NOTE — Telephone Encounter (Signed)
I can't find any phone #s for pt's daughter. I called and left detailed message on pt's VM.

## 2014-08-26 ENCOUNTER — Other Ambulatory Visit: Payer: Self-pay | Admitting: Cardiovascular Disease

## 2014-08-27 ENCOUNTER — Telehealth: Payer: Self-pay | Admitting: Cardiovascular Disease

## 2014-08-27 NOTE — Telephone Encounter (Signed)
Pt called in stating that she is out of her Amlodipine and would like it called in   Thanks

## 2014-08-27 NOTE — Telephone Encounter (Signed)
Left message for pt, refills have been sent to the pharmacy

## 2014-08-27 NOTE — Telephone Encounter (Signed)
Rx was sent to pharmacy electronically. 

## 2014-10-01 ENCOUNTER — Ambulatory Visit (INDEPENDENT_AMBULATORY_CARE_PROVIDER_SITE_OTHER): Payer: Medicare HMO | Admitting: Diagnostic Neuroimaging

## 2014-10-01 ENCOUNTER — Encounter: Payer: Self-pay | Admitting: Diagnostic Neuroimaging

## 2014-10-01 VITALS — BP 125/73 | HR 74 | Resp 85 | Ht 62.5 in | Wt 124.8 lb

## 2014-10-01 DIAGNOSIS — R413 Other amnesia: Secondary | ICD-10-CM

## 2014-10-01 NOTE — Patient Instructions (Signed)
Continue donepezil and sertraline.

## 2014-10-01 NOTE — Progress Notes (Signed)
[facility]  PATIENT: Judy Caldwell DOB: Jul 28, 1941  REFERRING CLINICIAN:  HISTORY FROM: patient and husband REASON FOR VISIT: follow up   HISTORICAL  CHIEF COMPLAINT:  Chief Complaint  Patient presents with  . Follow-up    memory    HISTORY OF PRESENT ILLNESS:   UPDAET 10/01/14: Since last visit, memory issues are stable. Marital discord is persistent. Husband reports that patient is having more confusion, memory loss around sundown time.   UPDATE 04/01/14: Since last visit patient is doing better with her mood on sertraline. Patient is accompanied by husband today for this visit. Memory is stable. Patient and husband are somewhat argumentative during my exam, but intermixed with joking.  PRIOR HPI (12/30/13): 73 year old right-handed female here for evaluation of memory loss. Patient is accompanied by her daughter for this visit. According to the patient she has not noticed any significant memory problems. She has some mild memory problems that she feels are normal for her age. Patient lives with her husband and she is able to maintain most of her activities daily living. Husband does most of the driving and cooking, which has not changed. Patient still able to balance her own checkbook and pays her own bills. Patient is a retired Pharmacist, hospital, Airline pilot, and graduated with bachelors and masters degrees. According to daughter over past one to 2 years patient has had progressive short-term memory problems. She is repeating herself increasingly and often gets stuck in a "loop". Patient asked the question, receive an answer from a friend or family member, and asked the same question again 2 minutes later. This loop may continue dozens or 100s of times a day. Symptoms are worse if patient is frazzled, agitated or frustrated. Patient has become increasingly angry and defensive regarding her symptoms. This is caused significant stress between family members. Also of  note patient has had increasing problems with stumbling and stooped posture.  REVIEW OF SYSTEMS: Full 14 system review of systems performed and notable only for insomnia apnea joint swelling joint pain agitation anxiety.    ALLERGIES: Allergies  Allergen Reactions  . Latex Hives  . Advil [Ibuprofen] Nausea Only  . Celebrex [Celecoxib] Nausea Only  . Eggs Or Egg-Derived Products Swelling    THROAT CLOSES  . Influenza Vac Split [Flu Virus Vaccine] Other (See Comments)    Throat swells    HOME MEDICATIONS: Outpatient Prescriptions Prior to Visit  Medication Sig Dispense Refill  . amLODipine (NORVASC) 10 MG tablet TAKE ONE TABLET BY MOUTH ONCE DAILY  30 tablet  4  . calcium-vitamin D (OSCAL WITH D) 500-200 MG-UNIT per tablet Take 1 tablet by mouth daily.      Marland Kitchen co-enzyme Q-10 30 MG capsule Take 30 mg by mouth 3 (three) times daily.      . COCONUT OIL PO Take by mouth 2 (two) times daily.      Marland Kitchen donepezil (ARICEPT) 5 MG tablet Take 1 tablet (5 mg total) by mouth at bedtime.  30 tablet  0  . sertraline (ZOLOFT) 25 MG tablet Take 1 tablet (25 mg total) by mouth daily.  30 tablet  12  . vitamin B-12 (CYANOCOBALAMIN) 100 MCG tablet Take 1,000 mcg by mouth daily.       Marland Kitchen gabapentin (NEURONTIN) 100 MG capsule Take 100 mg by mouth as needed. Take 1 cap daily for 2 days , 2 caps daily for 2 days, then 3 caps daily      . megestrol (MEGACE) 400 MG/10ML suspension Take  10 mLs (400 mg total) by mouth daily.  240 mL  5  . pravastatin (PRAVACHOL) 40 MG tablet Take 1 tablet (40 mg total) by mouth every evening.  90 tablet  3  . valACYclovir (VALTREX) 1000 MG tablet as needed. Take one tablet 3 times a day for one week       No facility-administered medications prior to visit.    PAST MEDICAL HISTORY: Past Medical History  Diagnosis Date  . Hypertension   . Hypercholesteremia   . Shoulder joint dislocation     PAST SURGICAL HISTORY: Past Surgical History  Procedure Laterality Date  .  Cholecystectomy    . Appendectomy      FAMILY HISTORY: Family History  Problem Relation Age of Onset  . Colon cancer Neg Hx   . Stomach cancer Neg Hx   . Kidney disease Mother     SOCIAL HISTORY:  History   Social History  . Marital Status: Married    Spouse Name: Chrissie Noa    Number of Children: 3  . Years of Education: BS/MA   Occupational History  . Retired    Social History Main Topics  . Smoking status: Never Smoker   . Smokeless tobacco: Never Used  . Alcohol Use: No  . Drug Use: No  . Sexual Activity: Not on file   Other Topics Concern  . Not on file   Social History Narrative   Patient lives at home with spouse.   Caffeine Use: chocolate     PHYSICAL EXAM  Filed Vitals:   10/01/14 1507  BP: 125/73  Pulse: 74  Resp: 85  Height: 5' 2.5" (1.588 m)  Weight: 124 lb 12.8 oz (56.609 kg)    Not recorded    Body mass index is 22.45 kg/(m^2).  MMSE - Mini Mental State Exam 10/01/2014  Orientation to time 5  Orientation to Place 5  Registration 3  Attention/ Calculation 4  Recall 0  Language- name 2 objects 2  Language- repeat 1  Language- follow 3 step command 3  Language- read & follow direction 1  Write a sentence 1  Copy design 1  Total score 26     GENERAL EXAM: Patient is in no distress; well developed, nourished and groomed; neck is supple  CARDIOVASCULAR: Regular rate and rhythm, no murmurs, no carotid bruits  NEUROLOGIC: MENTAL STATUS: awake, alert, language fluent, comprehension intact, naming intact, fund of knowledge appropriate. MMSE 26/30. AFT 14 CRANIAL NERVE: no papilledema on fundoscopic exam, pupils equal and reactive to light, visual fields full to confrontation, extraocular muscles intact, no nystagmus, facial sensation and strength symmetric, hearing intact, palate elevates symmetrically, uvula midline, shoulder shrug symmetric, tongue midline. MOTOR: normal bulk and tone, full strength in the BUE, BLE SENSORY: normal  and symmetric to light touch, temperature, vibration COORDINATION: finger-nose-finger, fine finger movements normal REFLEXES: deep tendon reflexes present and symmetric GAIT/STATION: narrow based gait; SLOW TO RISE AND WALK. HAS SINGLE POINT CANE.     DIAGNOSTIC DATA (LABS, IMAGING, TESTING) - I reviewed patient records, labs, notes, testing and imaging myself where available.  Lab Results  Component Value Date   WBC 4.7 07/26/2014   HGB 13.4 07/26/2014   HCT 40.3 07/26/2014   MCV 91.9 07/26/2014   PLT 269 07/22/2011      Component Value Date/Time   NA 141 07/26/2014 1317   K 3.7 07/26/2014 1317   CL 104 07/26/2014 1317   CO2 26 07/26/2014 1317   GLUCOSE 88 07/26/2014 1317  BUN 13 07/26/2014 1317   CREATININE 0.89 07/26/2014 1317   CREATININE 0.70 03/24/2012 1321   CALCIUM 9.7 07/26/2014 1317   PROT 7.5 07/26/2014 1317   ALBUMIN 4.4 07/26/2014 1317   AST 22 07/26/2014 1317   ALT 11 07/26/2014 1317   ALKPHOS 60 07/26/2014 1317   BILITOT 0.8 07/26/2014 1317   GFRNONAA 65 07/26/2014 1317   GFRAA 74 07/26/2014 1317   Lab Results  Component Value Date   CHOL 189 12/25/2013   HDL 76 12/25/2013   LDLCALC 101* 12/25/2013   TRIG 61 12/25/2013   CHOLHDL 2.5 12/25/2013   Lab Results  Component Value Date   HGBA1C 5.0 07/31/2012   Lab Results  Component Value Date   VITAMINB12 >1999* 12/30/2013   Lab Results  Component Value Date   TSH 1.024 07/26/2014    I reviewed images myself and agree with interpretation. -VRP  01/13/14 MRI brain (without) demonstrating:  1. Mild periventricular and subcortical foci of non-specific gliosis.  2. No acute findings.   ASSESSMENT AND PLAN  73 y.o. year old female here with progressive short-term memory problems since 2013. Also with poor insight, agitation, insomnia and mood changes. Symptoms and exam findings are suspicious for mild dementia. Neuropsychiatric symptoms and memory testing have improved on sertraline. Reported short term memory at home  remains poor. Of noted there is intermixed marital discord.   MMSE  12/30/13 - 24/30, AFT 8 04/02/14 - 29/30 10/01/14 - 26/30  Ddx: MCI vs mild dementia   PLAN: - continue sertraline and donepezil - safety and long term planning reviewed   Return in about 6 months (around 04/02/2015).    Penni Bombard, MD 78/67/6720, 9:47 PM Certified in Neurology, Neurophysiology and Neuroimaging  [facility] [address] [place], [place] [address] [phone]

## 2014-11-25 ENCOUNTER — Telehealth: Payer: Self-pay | Admitting: Diagnostic Neuroimaging

## 2014-11-25 NOTE — Telephone Encounter (Signed)
I called back.  Mr Samad says the patient has been taking Aricept 10mg  for several months.  Says recently the patient has began to have angry outburst, is foul mouthed and mean.  He would like to know what the provider recommends.  Please advise.  Thank you.

## 2014-11-25 NOTE — Telephone Encounter (Signed)
Continue current medications. May need to refer to social work or psychiatry. -VRP

## 2014-11-25 NOTE — Telephone Encounter (Signed)
Pt's husband is calling and stating that donepezil (ARICEPT) 5 MG tablet is making her have a foul mouth and mean.  He states that she taking 10 mg and wants to know if he can decrease it.  He also states that they need a new Rx.  Please call Mr. Nigg and advise.

## 2014-11-26 NOTE — Telephone Encounter (Signed)
Returned spouse call. Patient answered the phone. Spouse went out to the drug store, she will let him know I called.

## 2014-12-01 NOTE — Telephone Encounter (Signed)
Called No answer.

## 2015-02-04 ENCOUNTER — Other Ambulatory Visit: Payer: Self-pay | Admitting: Diagnostic Neuroimaging

## 2015-02-04 ENCOUNTER — Other Ambulatory Visit: Payer: Self-pay | Admitting: Cardiovascular Disease

## 2015-02-04 NOTE — Telephone Encounter (Signed)
Rx(s) sent to pharmacy electronically.  

## 2015-03-10 ENCOUNTER — Other Ambulatory Visit: Payer: Self-pay | Admitting: Cardiovascular Disease

## 2015-03-10 NOTE — Telephone Encounter (Signed)
Rx has been sent to the pharmacy electronically. ° °

## 2015-03-23 ENCOUNTER — Other Ambulatory Visit: Payer: Self-pay | Admitting: Diagnostic Neuroimaging

## 2015-03-26 ENCOUNTER — Telehealth: Payer: Self-pay | Admitting: Cardiovascular Disease

## 2015-03-26 MED ORDER — AMLODIPINE BESYLATE 10 MG PO TABS: 10.0000 mg | ORAL_TABLET | Freq: Every day | ORAL | Status: DC

## 2015-03-26 NOTE — Telephone Encounter (Signed)
Returned call to patient amlodipine refill sent to pharmacy.Advised to keep appointment with Dr.Croitoru 05/20/15.

## 2015-03-26 NOTE — Telephone Encounter (Signed)
Pt has appt in June. Will run of amlodipine in a week. Needs more refills. Pharm: [facility], [place].

## 2015-04-22 ENCOUNTER — Other Ambulatory Visit: Payer: Self-pay | Admitting: Diagnostic Neuroimaging

## 2015-04-27 ENCOUNTER — Telehealth: Payer: Self-pay | Admitting: Diagnostic Neuroimaging

## 2015-04-27 NOTE — Telephone Encounter (Signed)
It appears refills were sent on this med 05/18.  I called the pharmacy.  Spoke with pharmacist who said Rx has been ready for pick up since 05/18.  I called back.  Ms Rodick answered the phone, Mr Roeder was not available.  Advised the Rx was ready for pick up at the pharmacy.  She says they will pick it up later today.

## 2015-04-27 NOTE — Telephone Encounter (Signed)
Patient's husband is calling for refill for Rx donepezil 10 mg to [facility].  Thanks!

## 2015-05-05 ENCOUNTER — Encounter: Payer: Self-pay | Admitting: *Deleted

## 2015-05-05 ENCOUNTER — Telehealth: Payer: Self-pay | Admitting: Diagnostic Neuroimaging

## 2015-05-05 MED ORDER — SERTRALINE HCL 25 MG PO TABS: 25.0000 mg | ORAL_TABLET | Freq: Every day | ORAL | Status: DC

## 2015-05-05 NOTE — Telephone Encounter (Signed)
Judy Caldwell, pt's spouse called requesting a refill for sertraline (ZOLOFT) 25 MG tablet. Pharmacy: [facility] # [phone]. Judy Caldwell can be reached @ [phone]

## 2015-05-05 NOTE — Telephone Encounter (Signed)
Rx has been sent.  I called back to advise.  Got no answer.  Left message.   

## 2015-05-20 ENCOUNTER — Ambulatory Visit (INDEPENDENT_AMBULATORY_CARE_PROVIDER_SITE_OTHER): Payer: Medicare PPO | Admitting: Cardiovascular Disease

## 2015-05-20 ENCOUNTER — Encounter: Payer: Self-pay | Admitting: Cardiovascular Disease

## 2015-05-20 VITALS — BP 120/68 | HR 74 | Ht 62.0 in | Wt 114.0 lb

## 2015-05-20 DIAGNOSIS — I10 Essential (primary) hypertension: Secondary | ICD-10-CM | POA: Diagnosis not present

## 2015-05-20 DIAGNOSIS — E785 Hyperlipidemia, unspecified: Secondary | ICD-10-CM | POA: Diagnosis not present

## 2015-05-20 NOTE — Patient Instructions (Signed)
Dr. Croitoru recommends that you schedule a follow-up appointment in: One Year.   

## 2015-05-20 NOTE — Progress Notes (Signed)
Patient ID: Judy Caldwell, female   DOB: 1941/06/06, 74 y.o.   MRN: [id number]     Cardiology Office Note   Date:  05/21/2015   ID:  Judy Caldwell, DOB Apr 20, 1941, MRN [id number]  PCP:  Jenny Reichmann, MD  Cardiologist:   Sanda Klein, MD   Chief Complaint  Patient presents with  . Follow-up    Patients has no complaints.      History of Present Illness: Judy Caldwell is a 74 y.o. female who presents for follow-up of hypertension and upper lipidemia. She has not had any serious medical problems but her memory continues to slowly deteriorate. She is no longer taking lipid lowering medications due to the concern surrounding her cognition. Stopping statin therapy has really not had any positive impact. She is taking cholinesterase inhibitors with marginal benefit. She has not had any cardiac complaints. The pressure control is excellent. She denies syncope, dizziness, palpitations, chest pain or shortness of breath or lower extremity edema.    Past Medical History  Diagnosis Date  . Hypertension   . Hypercholesteremia   . Shoulder joint dislocation     Past Surgical History  Procedure Laterality Date  . Cholecystectomy    . Appendectomy       Current Outpatient Prescriptions  Medication Sig Dispense Refill  . amLODipine (NORVASC) 10 MG tablet Take 1 tablet (10 mg total) by mouth daily. 30 tablet 2  . calcium-vitamin D (OSCAL WITH D) 500-200 MG-UNIT per tablet Take 1 tablet by mouth daily.    Marland Kitchen co-enzyme Q-10 30 MG capsule Take 30 mg by mouth 3 (three) times daily.    . COCONUT OIL PO Take by mouth 2 (two) times daily.    Marland Kitchen donepezil (ARICEPT) 10 MG tablet TAKE ONE TABLET BY MOUTH AT BEDTIME 30 tablet 6  . sertraline (ZOLOFT) 25 MG tablet Take 1 tablet (25 mg total) by mouth daily. 30 tablet 0  . vitamin B-12 (CYANOCOBALAMIN) 100 MCG tablet Take 1,000 mcg by mouth daily.      No current facility-administered medications for this visit.    Allergies:    Latex; Advil; Celebrex; Eggs or egg-derived products; and Influenza vac split    Social History:  The patient  reports that she has never smoked. She has never used smokeless tobacco. She reports that she does not drink alcohol or use illicit drugs.   Family History:  The patient's family history includes Kidney disease in her mother. There is no history of Colon cancer or Stomach cancer.    ROS:  Please see the history of present illness.    Otherwise, review of systems positive for none.   All other systems are reviewed and negative.    PHYSICAL EXAM: VS:  BP 120/68 mmHg  Pulse 74  Ht 5\' 2"  (1.575 m)  Wt 114 lb (51.71 kg)  BMI 20.85 kg/m2 , BMI Body mass index is 20.85 kg/(m^2).  General: Alert, oriented x3, no distress Head: no evidence of trauma, PERRL, EOMI, no exophtalmos or lid lag, no myxedema, no xanthelasma; normal ears, nose and oropharynx Neck: normal jugular venous pulsations and no hepatojugular reflux; brisk carotid pulses without delay and no carotid bruits Chest: clear to auscultation, no signs of consolidation by percussion or palpation, normal fremitus, symmetrical and full respiratory excursions Cardiovascular: normal position and quality of the apical impulse, regular rhythm, normal first and second heart sounds, no murmurs, rubs or gallops Abdomen: no tenderness or distention, no masses by palpation, no abnormal  pulsatility or arterial bruits, normal bowel sounds, no hepatosplenomegaly Extremities: no clubbing, cyanosis or edema; 2+ radial, ulnar and brachial pulses bilaterally; 2+ right femoral, posterior tibial and dorsalis pedis pulses; 2+ left femoral, posterior tibial and dorsalis pedis pulses; no subclavian or femoral bruits Neurological: grossly nonfocal Psych: euthymic mood, full affect   EKG:  EKG is ordered today. The ekg ordered today demonstrates normal sinus rhythm, QS pattern in leads V1-V2, QTC 446 ms   Recent Labs: 07/26/2014: ALT 11; BUN 13;  Creat 0.89; Hemoglobin 13.4; Potassium 3.7; Sodium 141; TSH 1.024    Lipid Panel    Component Value Date/Time   CHOL 189 12/25/2013 1111   TRIG 61 12/25/2013 1111   HDL 76 12/25/2013 1111   CHOLHDL 2.5 12/25/2013 1111   VLDL 12 12/25/2013 1111   LDLCALC 101* 12/25/2013 1111      Wt Readings from Last 3 Encounters:  05/20/15 114 lb (51.71 kg)  10/01/14 124 lb 12.8 oz (56.609 kg)  07/26/14 126 lb 8 oz (57.38 kg)      Other studies Reviewed: Additional studies/ records that were reviewed today include: Records from her neurologist, Dr. Leta Baptist  ASSESSMENT AND PLAN:  Mrs. Sferrazza is asymptomatic from a cardiac standpoint and has well-controlled systemic hypertension. She does not have known vascular disease or coronary problems and I think there is no compelling reason for her to restart taking a statin, especially with her concerns surrounding her cognitive deficits.   Current medicines are reviewed at length with the patient today.  The patient does not have concerns regarding medicines.  The following changes have been made:  no change  Labs/ tests ordered today include:  Orders Placed This Encounter  Procedures  . EKG 12-Lead   Patient Instructions  Dr. Sallyanne Kuster recommends that you schedule a follow-up appointment in: One Year.      Mikael Spray, MD  05/21/2015 11:24 AM    Sanda Klein, MD, [facility] [phone] office [phone] pager

## 2015-07-13 ENCOUNTER — Other Ambulatory Visit: Payer: Self-pay | Admitting: Diagnostic Neuroimaging

## 2015-08-19 ENCOUNTER — Other Ambulatory Visit: Payer: Self-pay | Admitting: Diagnostic Neuroimaging

## 2015-09-08 ENCOUNTER — Encounter: Payer: Self-pay | Admitting: Emergency Medicine

## 2015-09-28 ENCOUNTER — Other Ambulatory Visit: Payer: Self-pay | Admitting: Diagnostic Neuroimaging

## 2015-09-29 ENCOUNTER — Other Ambulatory Visit: Payer: Self-pay | Admitting: Diagnostic Neuroimaging

## 2015-10-14 ENCOUNTER — Ambulatory Visit (INDEPENDENT_AMBULATORY_CARE_PROVIDER_SITE_OTHER): Payer: Medicare PPO | Admitting: Diagnostic Neuroimaging

## 2015-10-14 ENCOUNTER — Encounter: Payer: Self-pay | Admitting: Diagnostic Neuroimaging

## 2015-10-14 VITALS — BP 152/81 | HR 79 | Ht 62.0 in | Wt 121.6 lb

## 2015-10-14 DIAGNOSIS — F03A0 Unspecified dementia, mild, without behavioral disturbance, psychotic disturbance, mood disturbance, and anxiety: Secondary | ICD-10-CM

## 2015-10-14 DIAGNOSIS — F0390 Unspecified dementia without behavioral disturbance: Secondary | ICD-10-CM | POA: Diagnosis not present

## 2015-10-14 DIAGNOSIS — R413 Other amnesia: Secondary | ICD-10-CM | POA: Diagnosis not present

## 2015-10-14 MED ORDER — SERTRALINE HCL 25 MG PO TABS: 25.0000 mg | ORAL_TABLET | Freq: Every day | ORAL | Status: DC

## 2015-10-14 MED ORDER — DONEPEZIL HCL 10 MG PO TABS: 10.0000 mg | ORAL_TABLET | Freq: Every day | ORAL | Status: DC

## 2015-10-14 NOTE — Patient Instructions (Signed)
Thank you for coming to see [place] at [facility]. I hope we have been able to provide you high quality care today.  You may receive a patient satisfaction survey over the next few weeks. We would appreciate your feedback and comments so that we may continue to improve ourselves and the health of our patients.  - continue sertraline and donepezil - consider CREAD research study   ~~~~~~~~~~~~~~~~~~~~~~~~~~~~~~~~~~~~~~~~~~~~~~~~~~~~~~~~~~~~~~~~~  DR. PENUMALLI'S GUIDE TO HAPPY AND HEALTHY LIVING These are some of my general health and wellness recommendations. Some of them may apply to you better than others. Please use common sense as you try these suggestions and feel free to ask me any questions.   ACTIVITY/FITNESS Mental, social, emotional and physical stimulation are very important for brain and body health. Try learning a new activity (arts, music, language, sports, games).  Keep moving your body to the best of your abilities. You can do this at home, inside or outside, the park, community center, gym or anywhere you like. Consider a physical therapist or personal trainer to get started. Consider the app Sworkit. Fitness trackers such as smart-watches, smart-phones or Fitbits can help as well.   NUTRITION Eat more plants: colorful vegetables, nuts, seeds and berries.  Eat less sugar, salt, preservatives and processed foods.  Avoid toxins such as cigarettes and alcohol.  Drink water when you are thirsty. Warm water with a slice of lemon is an excellent morning drink to start the day.  Consider these websites for more information The Nutrition Source (https://www.henry-hernandez.biz/) Precision Nutrition (WindowBlog.ch)   RELAXATION Consider practicing mindfulness meditation or other relaxation techniques such as deep breathing, prayer, yoga, tai chi, massage. See website mindful.org or the apps Headspace or Calm to help  get started.   SLEEP Try to get at least 7-8+ hours sleep per day. Regular exercise and reduced caffeine will help you sleep better. Practice good sleep hygeine techniques. See website sleep.org for more information.   PLANNING Prepare estate planning, living will, healthcare POA documents. Sometimes this is best planned with the help of an attorney. Theconversationproject.org and agingwithdignity.org are excellent resources.

## 2015-10-14 NOTE — Progress Notes (Signed)
[facility]  PATIENT: Judy Caldwell DOB: 1941-05-03  REFERRING CLINICIAN:  HISTORY FROM: patient and husband REASON FOR VISIT: follow up   HISTORICAL  CHIEF COMPLAINT:  Chief Complaint  Patient presents with  . Memory Loss    rm 7, husband Chrissie Noa, MMSE 23  . Follow-up    1 year    HISTORY OF PRESENT ILLNESS:   UPDATE 10/14/15: Since last visit, memory loss is progressing. Patient still in denial. Sundown confusion continues.   UPDATE 10/01/14: Since last visit, memory issues are stable. Marital discord is persistent. Husband reports that patient is having more confusion, memory loss around sundown time.   UPDATE 04/01/14: Since last visit patient is doing better with her mood on sertraline. Patient is accompanied by husband today for this visit. Memory is stable. Patient and husband are somewhat argumentative during my exam, but intermixed with joking.  PRIOR HPI (12/30/13): 74 year old right-handed female here for evaluation of memory loss. Patient is accompanied by her daughter for this visit. According to the patient she has not noticed any significant memory problems. She has some mild memory problems that she feels are normal for her age. Patient lives with her husband and she is able to maintain most of her activities daily living. Husband does most of the driving and cooking, which has not changed. Patient still able to balance her own checkbook and pays her own bills. Patient is a retired Pharmacist, hospital, Airline pilot, and graduated with bachelors and masters degrees. According to daughter over past one to 2 years patient has had progressive short-term memory problems. She is repeating herself increasingly and often gets stuck in a "loop". Patient asked the question, receive an answer from a friend or family member, and asked the same question again 2 minutes later. This loop may continue dozens or 100s of times a day. Symptoms are worse if patient is  frazzled, agitated or frustrated. Patient has become increasingly angry and defensive regarding her symptoms. This is caused significant stress between family members. Also of note patient has had increasing problems with stumbling and stooped posture.  REVIEW OF SYSTEMS: Full 14 system review of systems performed and notable only for appetite change food allergies cold intolerance depression.   ALLERGIES: Allergies  Allergen Reactions  . Latex Hives  . Advil [Ibuprofen] Nausea Only  . Celebrex [Celecoxib] Nausea Only  . Eggs Or Egg-Derived Products Swelling    THROAT CLOSES  . Influenza Vac Split [Flu Virus Vaccine] Other (See Comments)    Throat swells    HOME MEDICATIONS: Outpatient Prescriptions Prior to Visit  Medication Sig Dispense Refill  . amLODipine (NORVASC) 10 MG tablet Take 1 tablet (10 mg total) by mouth daily. 30 tablet 2  . calcium-vitamin D (OSCAL WITH D) 500-200 MG-UNIT per tablet Take 1 tablet by mouth daily.    Marland Kitchen co-enzyme Q-10 30 MG capsule Take 30 mg by mouth 3 (three) times daily.    . COCONUT OIL PO Take by mouth 2 (two) times daily.    Marland Kitchen donepezil (ARICEPT) 10 MG tablet TAKE ONE TABLET BY MOUTH AT BEDTIME 30 tablet 6  . sertraline (ZOLOFT) 25 MG tablet TAKE ONE TABLET BY MOUTH ONCE DAILY. PLEASE SCHEDULE FOLLOW UP APPOINTMENT. 15 tablet 0  . vitamin B-12 (CYANOCOBALAMIN) 100 MCG tablet Take 1,000 mcg by mouth daily.      No facility-administered medications prior to visit.    PAST MEDICAL HISTORY: Past Medical History  Diagnosis Date  . Hypertension   .  Hypercholesteremia   . Shoulder joint dislocation     PAST SURGICAL HISTORY: Past Surgical History  Procedure Laterality Date  . Cholecystectomy    . Appendectomy      FAMILY HISTORY: Family History  Problem Relation Age of Onset  . Colon cancer Neg Hx   . Stomach cancer Neg Hx   . Kidney disease Mother     SOCIAL HISTORY:  Social History   Social History  . Marital Status: Married     Spouse Name: Chrissie Noa  . Number of Children: 3  . Years of Education: BS/MA   Occupational History  . Retired    Social History Main Topics  . Smoking status: Never Smoker   . Smokeless tobacco: Never Used  . Alcohol Use: No  . Drug Use: No  . Sexual Activity: Not on file   Other Topics Concern  . Not on file   Social History Narrative   Patient lives at home with spouse.   Caffeine Use: chocolate     PHYSICAL EXAM  Filed Vitals:   10/14/15 1308  BP: 152/81  Pulse: 79  Height: 5\' 2"  (1.575 m)  Weight: 121 lb 9.6 oz (55.157 kg)    Not recorded      Body mass index is 22.24 kg/(m^2).  MMSE - Mini Mental State Exam 10/14/2015 10/01/2014  Orientation to time 2 5  Orientation to Place 4 5  Registration 3 3  Attention/ Calculation 5 4  Recall 0 0  Language- name 2 objects 2 2  Language- repeat 1 1  Language- follow 3 step command 3 3  Language- read & follow direction 1 1  Write a sentence 1 1  Copy design 1 1  Total score 23 26     GENERAL EXAM: Patient is in no distress; well developed, nourished and groomed; neck is supple  CARDIOVASCULAR: Regular rate and rhythm, no murmurs, no carotid bruits  NEUROLOGIC: MENTAL STATUS: awake, alert, language fluent, comprehension intact, naming intact, fund of knowledge appropriate.  CRANIAL NERVE: pupils equal and reactive to light, visual fields full to confrontation, extraocular muscles intact, no nystagmus, facial sensation and strength symmetric, hearing intact, palate elevates symmetrically, uvula midline, shoulder shrug symmetric, tongue midline. MOTOR: normal bulk and tone, full strength in the BUE, BLE SENSORY: normal and symmetric to light touch, temperature, vibration COORDINATION: finger-nose-finger, fine finger movements normal REFLEXES: deep tendon reflexes present and symmetric GAIT/STATION: narrow based gait; SLOW TO RISE AND WALK. HAS SINGLE POINT CANE.     DIAGNOSTIC DATA (LABS, IMAGING,  TESTING) - I reviewed patient records, labs, notes, testing and imaging myself where available.  Lab Results  Component Value Date   WBC 4.7 07/26/2014   HGB 13.4 07/26/2014   HCT 40.3 07/26/2014   MCV 91.9 07/26/2014   PLT 269 07/22/2011      Component Value Date/Time   NA 141 07/26/2014 1317   K 3.7 07/26/2014 1317   CL 104 07/26/2014 1317   CO2 26 07/26/2014 1317   GLUCOSE 88 07/26/2014 1317   BUN 13 07/26/2014 1317   CREATININE 0.89 07/26/2014 1317   CREATININE 0.70 03/24/2012 1321   CALCIUM 9.7 07/26/2014 1317   PROT 7.5 07/26/2014 1317   ALBUMIN 4.4 07/26/2014 1317   AST 22 07/26/2014 1317   ALT 11 07/26/2014 1317   ALKPHOS 60 07/26/2014 1317   BILITOT 0.8 07/26/2014 1317   GFRNONAA 65 07/26/2014 1317   GFRAA 74 07/26/2014 1317   Lab Results  Component Value Date  CHOL 189 12/25/2013   HDL 76 12/25/2013   LDLCALC 101* 12/25/2013   TRIG 61 12/25/2013   CHOLHDL 2.5 12/25/2013   Lab Results  Component Value Date   HGBA1C 5.0 07/31/2012   Lab Results  Component Value Date   VITAMINB12 >1999* 12/30/2013   Lab Results  Component Value Date   TSH 1.024 07/26/2014     01/13/14 MRI brain (without) demonstrating:  1. Mild periventricular and subcortical foci of non-specific gliosis.  2. No acute findings.   ASSESSMENT AND PLAN  74 y.o. year old female here with progressive short-term memory problems since 2013. Also with poor insight, agitation, insomnia and mood changes. Symptoms and exam findings are suspicious for mild dementia. Neuropsychiatric symptoms and memory testing have improved on sertraline. Reported short term memory at home remains poor.   MMSE  12/30/13 - 24/30, AFT 8 04/02/14 - 29/30 10/01/14 - 26/30 10/14/15 - 23/30  Ddx: MCI vs mild dementia   PLAN: - continue sertraline and donepezil - safety and long term planning reviewed  - consider CREAD study vs starting memantine; information given to patient  Return in about 6 months  (around 04/12/2016).    Penni Bombard, MD 40/08/8118, 1:47 PM Certified in Neurology, Neurophysiology and Neuroimaging  [facility] [address] [place], [place] [address] [phone]

## 2015-10-20 ENCOUNTER — Telehealth: Payer: Self-pay

## 2015-10-20 NOTE — Telephone Encounter (Signed)
I left a message for the patient to return my call.

## 2015-11-23 ENCOUNTER — Telehealth: Payer: Self-pay | Admitting: Diagnostic Neuroimaging

## 2015-11-23 NOTE — Telephone Encounter (Signed)
It appears both of these Rx's were sent to [facility] last month at [place] for 1 year supply.  I called the pharmacy and spoke with Al.  He verified patient has 12 refills on both meds.  Says when they originally requested refills, they were too soon to go through on ins.  They will fill Rx's and notify patient when they are ready for pick up.

## 2015-11-23 NOTE — Telephone Encounter (Signed)
Request refill for Sertraline and Donepezil be called into [facility].

## 2015-11-23 NOTE — Telephone Encounter (Signed)
Judy Caldwell called to request refill of sertraline (ZOLOFT) 25 MG tablet and donepezil (ARICEPT) 10 MG tablet.

## 2016-04-05 ENCOUNTER — Ambulatory Visit: Payer: Medicare PPO | Admitting: Diagnostic Neuroimaging

## 2016-04-05 ENCOUNTER — Encounter: Payer: Self-pay | Admitting: Diagnostic Neuroimaging

## 2016-04-05 ENCOUNTER — Ambulatory Visit (INDEPENDENT_AMBULATORY_CARE_PROVIDER_SITE_OTHER): Payer: Medicare Other | Admitting: Diagnostic Neuroimaging

## 2016-04-05 VITALS — BP 145/82 | HR 78 | Ht 62.0 in | Wt 132.0 lb

## 2016-04-05 DIAGNOSIS — R4586 Emotional lability: Secondary | ICD-10-CM

## 2016-04-05 DIAGNOSIS — F411 Generalized anxiety disorder: Secondary | ICD-10-CM

## 2016-04-05 DIAGNOSIS — F39 Unspecified mood [affective] disorder: Secondary | ICD-10-CM | POA: Diagnosis not present

## 2016-04-05 DIAGNOSIS — F03A0 Unspecified dementia, mild, without behavioral disturbance, psychotic disturbance, mood disturbance, and anxiety: Secondary | ICD-10-CM

## 2016-04-05 DIAGNOSIS — F0390 Unspecified dementia without behavioral disturbance: Secondary | ICD-10-CM

## 2016-04-05 MED ORDER — DONEPEZIL HCL 10 MG PO TABS: 10.0000 mg | ORAL_TABLET | Freq: Every day | ORAL | Status: AC

## 2016-04-05 MED ORDER — SERTRALINE HCL 25 MG PO TABS: 25.0000 mg | ORAL_TABLET | Freq: Every day | ORAL | Status: AC

## 2016-04-05 MED ORDER — MEMANTINE HCL 10 MG PO TABS: 10.0000 mg | ORAL_TABLET | Freq: Two times a day (BID) | ORAL | Status: DC

## 2016-04-05 NOTE — Progress Notes (Signed)
[facility]  PATIENT: Judy Caldwell DOB: August 25, 1941  REFERRING CLINICIAN:  HISTORY FROM: patient and husband REASON FOR VISIT: follow up   HISTORICAL  CHIEF COMPLAINT:  Chief Complaint  Patient presents with  . Memory Loss    rm 7, husbandChrissie Noa, MMSE  22  . Follow-up    6 month    HISTORY OF PRESENT ILLNESS:   UPDATE 04/05/16: Since last visit, memory loss progressing. Patient in denial. Mood stable. Patient covers up for her deficits.   UPDATE 10/14/15: Since last visit, memory loss is progressing. Patient still in denial. Sundown confusion continues.   UPDATE 10/01/14: Since last visit, memory issues are stable. Marital discord is persistent. Husband reports that patient is having more confusion, memory loss around sundown time.   UPDATE 04/01/14: Since last visit patient is doing better with her mood on sertraline. Patient is accompanied by husband today for this visit. Memory is stable. Patient and husband are somewhat argumentative during my exam, but intermixed with joking.  PRIOR HPI (12/30/13): 75 year old right-handed female here for evaluation of memory loss. Patient is accompanied by her daughter for this visit. According to the patient she has not noticed any significant memory problems. She has some mild memory problems that she feels are normal for her age. Patient lives with her husband and she is able to maintain most of her activities daily living. Husband does most of the driving and cooking, which has not changed. Patient still able to balance her own checkbook and pays her own bills. Patient is a retired Pharmacist, hospital, Airline pilot, and graduated with bachelors and masters degrees. According to daughter over past one to 2 years patient has had progressive short-term memory problems. She is repeating herself increasingly and often gets stuck in a "loop". Patient asked the question, receive an answer from a friend or family member, and asked  the same question again 2 minutes later. This loop may continue dozens or 100s of times a day. Symptoms are worse if patient is frazzled, agitated or frustrated. Patient has become increasingly angry and defensive regarding her symptoms. This is caused significant stress between family members. Also of note patient has had increasing problems with stumbling and stooped posture.   REVIEW OF SYSTEMS: Full 14 system review of systems performed and negative except for joint pain.   ALLERGIES: Allergies  Allergen Reactions  . Latex Hives  . Advil [Ibuprofen] Nausea Only  . Celebrex [Celecoxib] Nausea Only  . Eggs Or Egg-Derived Products Swelling    THROAT CLOSES  . Influenza Vac Split [Flu Virus Vaccine] Other (See Comments)    Throat swells    HOME MEDICATIONS: Outpatient Prescriptions Prior to Visit  Medication Sig Dispense Refill  . calcium-vitamin D (OSCAL WITH D) 500-200 MG-UNIT per tablet Take 1 tablet by mouth daily.    Marland Kitchen co-enzyme Q-10 30 MG capsule Take 30 mg by mouth 3 (three) times daily.    Marland Kitchen donepezil (ARICEPT) 10 MG tablet Take 1 tablet (10 mg total) by mouth at bedtime. 30 tablet 12  . sertraline (ZOLOFT) 25 MG tablet Take 1 tablet (25 mg total) by mouth at bedtime. 30 tablet 12  . vitamin B-12 (CYANOCOBALAMIN) 100 MCG tablet Take 1,000 mcg by mouth daily.     Marland Kitchen amLODipine (NORVASC) 10 MG tablet Take 1 tablet (10 mg total) by mouth daily. 30 tablet 2  . COCONUT OIL PO Take by mouth 2 (two) times daily.     No facility-administered medications prior to  visit.    PAST MEDICAL HISTORY: Past Medical History  Diagnosis Date  . Hypertension   . Hypercholesteremia   . Shoulder joint dislocation     PAST SURGICAL HISTORY: Past Surgical History  Procedure Laterality Date  . Cholecystectomy    . Appendectomy      FAMILY HISTORY: Family History  Problem Relation Age of Onset  . Colon cancer Neg Hx   . Stomach cancer Neg Hx   . Kidney disease Mother     SOCIAL  HISTORY:  Social History   Social History  . Marital Status: Married    Spouse Name: Chrissie Noa  . Number of Children: 3  . Years of Education: BS/MA   Occupational History  . Retired    Social History Main Topics  . Smoking status: Never Smoker   . Smokeless tobacco: Never Used  . Alcohol Use: No  . Drug Use: No  . Sexual Activity: Not on file   Other Topics Concern  . Not on file   Social History Narrative   Patient lives at home with spouse.   Caffeine Use: chocolate     PHYSICAL EXAM  Filed Vitals:   04/05/16 1351  BP: 145/82  Pulse: 78  Height: 5\' 2"  (1.575 m)  Weight: 132 lb (59.875 kg)    Not recorded      Body mass index is 24.14 kg/(m^2).  MMSE - Mini Mental State Exam 04/05/2016 10/14/2015 10/01/2014  Orientation to time 0 2 5  Orientation to Place 5 4 5   Registration 3 3 3   Attention/ Calculation 5 5 4   Recall 0 0 0  Language- name 2 objects 2 2 2   Language- repeat 1 1 1   Language- follow 3 step command 3 3 3   Language- read & follow direction 1 1 1   Write a sentence 1 1 1   Copy design 1 1 1   Total score 22 23 26      GENERAL EXAM: Patient is in no distress; well developed, nourished and groomed; neck is supple  CARDIOVASCULAR: Regular rate and rhythm, no murmurs, no carotid bruits  NEUROLOGIC: MENTAL STATUS: awake, alert, language fluent, comprehension intact, naming intact, fund of knowledge appropriate. PLEASANT MOOD. CRANIAL NERVE: pupils equal and reactive to light, visual fields full to confrontation, extraocular muscles intact, no nystagmus, facial sensation and strength symmetric, hearing intact, palate elevates symmetrically, uvula midline, shoulder shrug symmetric, tongue midline. MOTOR: normal bulk and tone, full strength in the BUE, BLE SENSORY: normal and symmetric to light touch, temperature, vibration COORDINATION: finger-nose-finger, fine finger movements normal REFLEXES: deep tendon reflexes present and  symmetric GAIT/STATION: narrow based gait; SLOW TO RISE AND WALK. HAS SINGLE POINT CANE.     DIAGNOSTIC DATA (LABS, IMAGING, TESTING) - I reviewed patient records, labs, notes, testing and imaging myself where available.  Lab Results  Component Value Date   WBC 4.7 07/26/2014   HGB 13.4 07/26/2014   HCT 40.3 07/26/2014   MCV 91.9 07/26/2014   PLT 269 07/22/2011      Component Value Date/Time   NA 141 07/26/2014 1317   K 3.7 07/26/2014 1317   CL 104 07/26/2014 1317   CO2 26 07/26/2014 1317   GLUCOSE 88 07/26/2014 1317   BUN 13 07/26/2014 1317   CREATININE 0.89 07/26/2014 1317   CREATININE 0.70 03/24/2012 1321   CALCIUM 9.7 07/26/2014 1317   PROT 7.5 07/26/2014 1317   ALBUMIN 4.4 07/26/2014 1317   AST 22 07/26/2014 1317   ALT 11 07/26/2014 1317  ALKPHOS 60 07/26/2014 1317   BILITOT 0.8 07/26/2014 1317   GFRNONAA 65 07/26/2014 1317   GFRAA 74 07/26/2014 1317   Lab Results  Component Value Date   CHOL 189 12/25/2013   HDL 76 12/25/2013   LDLCALC 101* 12/25/2013   TRIG 61 12/25/2013   CHOLHDL 2.5 12/25/2013   Lab Results  Component Value Date   HGBA1C 5.0 07/31/2012   Lab Results  Component Value Date   VITAMINB12 >1999* 12/30/2013   Lab Results  Component Value Date   TSH 1.024 07/26/2014     01/13/14 MRI brain (without) demonstrating:  1. Mild periventricular and subcortical foci of non-specific gliosis.  2. No acute findings.   ASSESSMENT AND PLAN  75 y.o. year old female here with progressive short-term memory problems since 2013. Also with poor insight, agitation, insomnia and mood changes. Symptoms and exam findings are consistent with mild dementia. Neuropsychiatric symptoms stable on sertraline. Short term memory at home remains poor.   MMSE  12/30/13 - 24/30, AFT 8 04/02/14 - 29/30 10/01/14 - 26/30 10/14/15 - 23/30 04/05/16 - 22/30  Dx:  Mild dementia  Anxiety state  Mood changes ([place])    PLAN: - add memantine - continue sertraline  and donepezil - safety and long term planning reviewed   Meds ordered this encounter  Medications  . sertraline (ZOLOFT) 25 MG tablet    Sig: Take 1 tablet (25 mg total) by mouth at bedtime.    Dispense:  30 tablet    Refill:  12  . donepezil (ARICEPT) 10 MG tablet    Sig: Take 1 tablet (10 mg total) by mouth at bedtime.    Dispense:  30 tablet    Refill:  12  . memantine (NAMENDA) 10 MG tablet    Sig: Take 1 tablet (10 mg total) by mouth 2 (two) times daily.    Dispense:  60 tablet    Refill:  12   Return in about 6 months (around 10/06/2016).    Penni Bombard, MD [id number], [id number] PM Certified in Neurology, Neurophysiology and Neuroimaging  [facility] [address], [place], [place] [address] [phone]

## 2016-04-05 NOTE — Patient Instructions (Signed)
-   add memantine 10mg  daily; after 1-2 weeks, increase to twice a day - continue donepezil and sertraline

## 2016-04-12 ENCOUNTER — Ambulatory Visit: Payer: Medicare PPO | Admitting: Diagnostic Neuroimaging

## 2016-08-30 ENCOUNTER — Telehealth: Payer: Self-pay | Admitting: *Deleted

## 2016-08-30 NOTE — Telephone Encounter (Signed)
Attempted to reach husband to possibly reschedule patient's follow up. No answer, mail box full.

## 2016-10-06 ENCOUNTER — Ambulatory Visit: Payer: Medicare Other | Admitting: Diagnostic Neuroimaging

## 2016-10-18 ENCOUNTER — Encounter: Payer: Self-pay | Admitting: Diagnostic Neuroimaging

## 2016-10-18 ENCOUNTER — Ambulatory Visit (INDEPENDENT_AMBULATORY_CARE_PROVIDER_SITE_OTHER): Payer: Medicare Other | Admitting: Diagnostic Neuroimaging

## 2016-10-18 VITALS — BP 129/79 | HR 84 | Wt 139.2 lb

## 2016-10-18 DIAGNOSIS — F0390 Unspecified dementia without behavioral disturbance: Secondary | ICD-10-CM | POA: Diagnosis not present

## 2016-10-18 DIAGNOSIS — F39 Unspecified mood [affective] disorder: Secondary | ICD-10-CM | POA: Diagnosis not present

## 2016-10-18 DIAGNOSIS — R4586 Emotional lability: Secondary | ICD-10-CM

## 2016-10-18 DIAGNOSIS — R413 Other amnesia: Secondary | ICD-10-CM

## 2016-10-18 DIAGNOSIS — F03A0 Unspecified dementia, mild, without behavioral disturbance, psychotic disturbance, mood disturbance, and anxiety: Secondary | ICD-10-CM

## 2016-10-18 DIAGNOSIS — F411 Generalized anxiety disorder: Secondary | ICD-10-CM

## 2016-10-18 NOTE — Progress Notes (Signed)
[facility]  PATIENT: Judy Caldwell DOB: 12-Feb-1941  REFERRING CLINICIAN:  HISTORY FROM: patient and husband REASON FOR VISIT: follow up   HISTORICAL  CHIEF COMPLAINT:  Chief Complaint  Patient presents with  . Dementia    rm 7, husbandChrissie Noa, MMSE 18  . Follow-up    6 month    HISTORY OF PRESENT ILLNESS:   UPDATE 10/18/16: Since last visit, symptoms continue, esp hallucinations and delusions (according to husband; patient denies). Husband is having caregiver stress due to progression of symptoms.   UPDATE 04/05/16: Since last visit, memory loss progressing. Patient in denial. Mood stable. Patient covers up for her deficits.   UPDATE 10/14/15: Since last visit, memory loss is progressing. Patient still in denial. Sundown confusion continues.   UPDATE 10/01/14: Since last visit, memory issues are stable. Marital discord is persistent. Husband reports that patient is having more confusion, memory loss around sundown time.   UPDATE 04/01/14: Since last visit patient is doing better with her mood on sertraline. Patient is accompanied by husband today for this visit. Memory is stable. Patient and husband are somewhat argumentative during my exam, but intermixed with joking.  PRIOR HPI (12/30/13): 75 year old right-handed female here for evaluation of memory loss. Patient is accompanied by her daughter for this visit. According to the patient she has not noticed any significant memory problems. She has some mild memory problems that she feels are normal for her age. Patient lives with her husband and she is able to maintain most of her activities daily living. Husband does most of the driving and cooking, which has not changed. Patient still able to balance her own checkbook and pays her own bills. Patient is a retired Pharmacist, hospital, Airline pilot, and graduated with bachelors and masters degrees. According to daughter over past one to 2 years patient has had  progressive short-term memory problems. She is repeating herself increasingly and often gets stuck in a "loop". Patient asked the question, receive an answer from a friend or family member, and asked the same question again 2 minutes later. This loop may continue dozens or 100s of times a day. Symptoms are worse if patient is frazzled, agitated or frustrated. Patient has become increasingly angry and defensive regarding her symptoms. This is caused significant stress between family members. Also of note patient has had increasing problems with stumbling and stooped posture.   REVIEW OF SYSTEMS: Full 14 system review of systems performed and negative except as per HPI.    ALLERGIES: Allergies  Allergen Reactions  . Latex Hives  . Advil [Ibuprofen] Nausea Only  . Celebrex [Celecoxib] Nausea Only  . Eggs Or Egg-Derived Products Swelling    THROAT CLOSES  . Influenza Vac Split [Flu Virus Vaccine] Other (See Comments)    Throat swells    HOME MEDICATIONS: Outpatient Medications Prior to Visit  Medication Sig Dispense Refill  . calcium-vitamin D (OSCAL WITH D) 500-200 MG-UNIT per tablet Take 1 tablet by mouth daily.    Marland Kitchen co-enzyme Q-10 30 MG capsule Take 30 mg by mouth 3 (three) times daily.    Marland Kitchen donepezil (ARICEPT) 10 MG tablet Take 1 tablet (10 mg total) by mouth at bedtime. 30 tablet 12  . memantine (NAMENDA) 10 MG tablet Take 1 tablet (10 mg total) by mouth 2 (two) times daily. 60 tablet 12  . sertraline (ZOLOFT) 25 MG tablet Take 1 tablet (25 mg total) by mouth at bedtime. 30 tablet 12  . vitamin B-12 (CYANOCOBALAMIN) 100 MCG tablet  Take 1,000 mcg by mouth daily.      No facility-administered medications prior to visit.     PAST MEDICAL HISTORY: Past Medical History:  Diagnosis Date  . Hypercholesteremia   . Hypertension   . Shoulder joint dislocation     PAST SURGICAL HISTORY: Past Surgical History:  Procedure Laterality Date  . APPENDECTOMY    . CHOLECYSTECTOMY       FAMILY HISTORY: Family History  Problem Relation Age of Onset  . Kidney disease Mother   . Colon cancer Neg Hx   . Stomach cancer Neg Hx     SOCIAL HISTORY:  Social History   Social History  . Marital status: Married    Spouse name: Chrissie Noa  . Number of children: 3  . Years of education: BS/MA   Occupational History  . Retired    Social History Main Topics  . Smoking status: Never Smoker  . Smokeless tobacco: Never Used  . Alcohol use No  . Drug use: No  . Sexual activity: Not on file   Other Topics Concern  . Not on file   Social History Narrative   Patient lives at home with spouse.   Caffeine Use: chocolate     PHYSICAL EXAM  Vitals:   10/18/16 1430  BP: 129/79  Pulse: 84  Weight: 139 lb 3.2 oz (63.1 kg)    Not recorded      Body mass index is 25.46 kg/m.  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 PLEASANT MOOD. CRANIAL NERVE: pupils equal and reactive to light, visual fields full to confrontation, extraocular muscles intact, no nystagmus, facial sensation and strength symmetric, hearing intact, palate elevates symmetrically, uvula midline, shoulder shrug symmetric,  tongue midline. MOTOR: normal bulk and tone, full strength in the BUE, BLE SENSORY: normal and symmetric to light touch, temperature, vibration COORDINATION: finger-nose-finger, fine finger movements normal REFLEXES: deep tendon reflexes present and symmetric GAIT/STATION: narrow based gait; SLOW TO RISE AND WALK. HAS SINGLE POINT CANE.     DIAGNOSTIC DATA (LABS, IMAGING, TESTING) - I reviewed patient records, labs, notes, testing and imaging myself where available.  Lab Results  Component Value Date   WBC 4.7 07/26/2014   HGB 13.4 07/26/2014   HCT 40.3 07/26/2014   MCV 91.9 07/26/2014   PLT 269 07/22/2011      Component Value Date/Time   NA 141 07/26/2014 1317   K 3.7 07/26/2014 1317   CL 104 07/26/2014 1317   CO2 26 07/26/2014 1317   GLUCOSE 88 07/26/2014 1317   BUN 13  07/26/2014 1317   CREATININE 0.89 07/26/2014 1317   CALCIUM 9.7 07/26/2014 1317   PROT 7.5 07/26/2014 1317   ALBUMIN 4.4 07/26/2014 1317   AST 22 07/26/2014 1317   ALT 11 07/26/2014 1317   ALKPHOS 60 07/26/2014 1317   BILITOT 0.8 07/26/2014 1317   GFRNONAA 65 07/26/2014 1317   GFRAA 74 07/26/2014 1317   Lab Results  Component Value Date   CHOL 189 12/25/2013   HDL 76 12/25/2013   LDLCALC 101 (H) 12/25/2013   TRIG 61 12/25/2013   CHOLHDL 2.5 12/25/2013   Lab Results  Component Value Date   HGBA1C 5.0 07/31/2012   Lab Results  Component Value Date   VITAMINB12 >1999 (H) 12/30/2013   Lab Results  Component Value Date   TSH 1.024 07/26/2014     01/13/14 MRI brain (without) demonstrating:  1. Mild periventricular and subcortical foci of non-specific gliosis.  2. No acute findings.   ASSESSMENT AND PLAN  75 y.o. year old female here with progressive short-term memory problems since 2013. Also with poor insight, agitation, insomnia and mood changes. Symptoms and exam findings are consistent with mild dementia. Neuropsychiatric symptoms stable on sertraline. Short term memory at home remains  poor.   MMSE  12/30/13 - 24/30, AFT 8 04/02/14 - 29/30 10/01/14 - 26/30 10/14/15 - 23/30 04/05/16 - 22/30 10/18/16 - 18/30, AFT 8   Dx:  Mild dementia  Anxiety state  Mood changes ([place])  Memory loss    PLAN: I spent 15 minutes of face to face time with patient. Greater than 50% of time was spent in counseling and coordination of care with patient. In summary we discussed:  - continue donepezil 10mg  + memantine 10mg  twice a day - continue sertraline - safety and long term planning reviewed   Return if symptoms worsen or fail to improve, for return to PCP.    Penni Bombard, MD [id number], 0000000 PM Certified in Neurology, Neurophysiology and Neuroimaging  [facility] [address], [place], [place] [address] [phone]

## 2017-04-11 ENCOUNTER — Other Ambulatory Visit: Payer: Self-pay | Admitting: Diagnostic Neuroimaging

## 2021-11-04 DEATH — deceased

## 2023-10-18 ENCOUNTER — Encounter: Payer: Self-pay | Admitting: Internal Medicine
